# Patient Record
Sex: Female | Born: 1955 | Race: Black or African American | Hispanic: No | Smoking: Never smoker
Health system: Southern US, Community
[De-identification: ages and names within clinical notes are randomized; demographics above are authoritative.]

## PROBLEM LIST (undated history)

## (undated) DIAGNOSIS — E0590 Thyrotoxicosis, unspecified without thyrotoxic crisis or storm: Secondary | ICD-10-CM

## (undated) DIAGNOSIS — E119 Type 2 diabetes mellitus without complications: Secondary | ICD-10-CM

## (undated) HISTORY — DX: Thyrotoxicosis, unspecified without thyrotoxic crisis or storm: E05.90

## (undated) HISTORY — DX: Type 2 diabetes mellitus without complications: E11.9

---

## 2017-02-25 ENCOUNTER — Encounter: Payer: Self-pay | Admitting: "Endocrinology

## 2017-02-25 ENCOUNTER — Ambulatory Visit (INDEPENDENT_AMBULATORY_CARE_PROVIDER_SITE_OTHER): Payer: Self-pay | Admitting: "Endocrinology

## 2017-02-25 VITALS — BP 144/75 | HR 106 | Ht 63.0 in | Wt 196.0 lb

## 2017-02-25 DIAGNOSIS — E0590 Thyrotoxicosis, unspecified without thyrotoxic crisis or storm: Secondary | ICD-10-CM | POA: Insufficient documentation

## 2017-02-25 NOTE — Progress Notes (Signed)
Subjective:    Patient ID: Kendra Gill, female    DOB: June 13, 1956, PCP No primary care provider on file..   Past Medical History:  Diagnosis Date  . Diabetes mellitus, type II (HCC)   . Hyperthyroidism    History reviewed. No pertinent surgical history. Social History   Social History  . Marital status: Widowed    Spouse name: N/A  . Number of children: N/A  . Years of education: N/A   Social History Main Topics  . Smoking status: Never Smoker  . Smokeless tobacco: Never Used  . Alcohol use No  . Drug use: No  . Sexual activity: Not Asked   Other Topics Concern  . None   Social History Narrative  . None   Outpatient Encounter Prescriptions as of 02/25/2017  Medication Sig  . metFORMIN (GLUCOPHAGE) 500 MG tablet Take by mouth 2 (two) times daily with a meal.  . Multiple Vitamin (MULTIVITAMIN) tablet Take 1 tablet by mouth daily.  . [DISCONTINUED] methimazole (TAPAZOLE) 10 MG tablet Take 100 mg by mouth daily.    No facility-administered encounter medications on file as of 02/25/2017.    ALLERGIES: No Known Allergies VACCINATION STATUS:  There is no immunization history on file for this patient.  HPI  The patient presents today with a medical history Of hyperthyroidism from Graves' disease as above, and is being seen in self-referral for same.  - She is not a good historian, reports that she was diagnosed with hyperthyroidism 3 years ago, however her record shows that she was diagnosed at least from 2013 which makes it 5 years. - Patient reports that she has seen endocrinology consultation's before but reportedly declined therapy with I-131 therapy. - She is currently on large doses of methimazole, 100 mg daily.  The patient has been dealing with symptoms of  palpitations, anxiety, weight loss, fatigue, inability to concentrate/focus, and irritability for on and off for several years. These symptoms are progressively worsening and troubling to patient.    The patient denies family history of thyroid dysfunction  ,  the patient denies personal history of goiter. Patient is not on any anti-thyroid medications nor on any thyroid hormone supplements. -  Patient  is hesitantly accepting to proceed with appropriate work up and therapy for thyrotoxicosis.   Review of Systems Constitutional: + weight loss, +fatigue, + subjective hyperthermia. Eyes: no blurry vision, no xerophthalmia ENT: no sore throat, no nodules palpated in throat, no dysphagia/odynophagia, no hoarseness Cardiovascular: no Chest Pain, no Shortness of Breath, + palpitations, no leg swelling Respiratory: no cough, no SOB Gastrointestinal: no Nausea/Vomiting/Diarhhea Musculoskeletal: no muscle/joint aches Skin: no rashes Neurological: + tremors, no numbness, no tingling, no dizziness Psychiatric: no depression, + anxiety  Objective:    BP (!) 144/75   Pulse (!) 106   Ht 5\' 3"  (1.6 m)   Wt 196 lb (88.9 kg)   BMI 34.72 kg/m   Wt Readings from Last 3 Encounters:  02/25/17 196 lb (88.9 kg)    Physical Exam  Constitutional:  Obese, not in acute distress,+ appears significantly hesitant, and has general distrust on medications. Eyes: PERRLA, EOMI, no exophthalmos.  ENT: moist mucous membranes, no thyromegaly, no cervical lymphadenopathy Cardiovascular: normal precordial activity, + tachycardic, no Murmur/Rubs/Gallops Respiratory:  adequate breathing efforts, no gross chest deformity, Clear to auscultation bilaterally Gastrointestinal: abdomen soft, Non -tender, No distension, Bowel Sounds present Musculoskeletal: no gross deformities, strength intact in all four extremities Skin: moist, warm, no rashes Neurological: + tremor  with outstretched hands, Deep tendon reflexes normal in all four extremities.  Her most recent labs from 12/13/2016 showed TSH suppressed to less than 0.002, free T4 high at 2.33 (this is while she is taking methimazole 100 g daily). - Thyroid uptake  and scan from [facility] in 2013 showed uniform uptake of 49% suggesting Graves' disease.  Assessment & Plan:   1. Hyperthyroidism  Patient's history and most recent labs are reviewed. Findings are consistent with thyrotoxicosis likely from hyperthyroidism/Graves' disease. The potential risks of untreated thyrotoxicosis and the need for definitive therapy have been discussed in detail with the patient, and the patient reluctantly accepts appropriate work up.  - Since her last uptake and scan is from 2013 which can't be used for making treatment decision at this time, she will need repeat thyroid uptake and scan after 5 days free of methimazole.  - I advised her to stop methimazole today, will obtain thyroid uptake and scan 5 days later and return in 10 days for reevaluation.    Therapy may involve RAI ablation of the thyroid, with subsequent need for lifelong thyroid hormone replacement. Patient is made aware of this fact and willing to proceed. - Given the fact that she took methimazole beyond maximal dose for multiple years and did not achieve control of Graves' disease, her best choice of therapy  left is I-131 thyroid ablation.  - She will need a low-dose beta blocker for symptomatic control until she gets definitive antithyroid therapy.  - 45 minutes of time was spent on the care of this patient , 50% of which was applied for counseling on complications of thyrotoxicosis, options ,  and the need for definitive therapy to prevent these complications.  - I advised patient to maintain close follow up with PMD for primary care needs.  Follow up plan: Return in about 10 days (around 03/07/2017) for follow up with thyroid uptake and scan.  Marquis Lunch, MD Phone: [phone]  Fax: [phone]   02/25/2017, 4:48 PM

## 2017-02-26 MED ORDER — PROPRANOLOL HCL 20 MG PO TABS: 20.0000 mg | ORAL_TABLET | Freq: Three times a day (TID) | ORAL | 2 refills | Status: DC

## 2017-03-04 ENCOUNTER — Encounter (HOSPITAL_COMMUNITY): Payer: Self-pay

## 2017-03-05 ENCOUNTER — Encounter (HOSPITAL_COMMUNITY): Payer: Self-pay

## 2017-03-11 ENCOUNTER — Ambulatory Visit: Payer: Self-pay | Admitting: "Endocrinology

## 2019-11-05 ENCOUNTER — Other Ambulatory Visit: Payer: Self-pay

## 2019-11-09 ENCOUNTER — Other Ambulatory Visit: Payer: Self-pay

## 2019-11-09 ENCOUNTER — Ambulatory Visit (INDEPENDENT_AMBULATORY_CARE_PROVIDER_SITE_OTHER): Payer: BLUE CROSS/BLUE SHIELD | Admitting: Internal Medicine

## 2019-11-09 ENCOUNTER — Encounter: Payer: Self-pay | Admitting: Internal Medicine

## 2019-11-09 VITALS — BP 124/78 | HR 100 | Temp 98.5°F | Ht 63.0 in | Wt 171.0 lb

## 2019-11-09 DIAGNOSIS — E0500 Thyrotoxicosis with diffuse goiter without thyrotoxic crisis or storm: Secondary | ICD-10-CM | POA: Diagnosis not present

## 2019-11-09 NOTE — Progress Notes (Signed)
Name: Kendra Gill  MRN/ DOB: [id number], [birth year]    Age/ Sex: 64 y.o., female    PCP: Sherrilee Gilles, DO   Reason for Endocrinology Evaluation: Graves' disease     Date of Initial Endocrinology Evaluation: 11/10/2019     HPI: [name] is a 64 y.o. female with a past medical history of graves' disease . The patient presented for initial endocrinology clinic visit on 11/10/2019 for consultative assistance with her Kendra Gill' disease.   She has been diagnosed with graves' disease in 2013, based on a thyroid uptake and scan. She was started on methimazole at that time, she has intermittent compliance due to concerns about side effects  She saw her PCP in 09/2019 and was found to have suppressed TSH < 0.001 uIU/mL with elevated FT4 2.12 and FT3 4.6.  She restarted it again 09/2019    She was having weight loss,with  occasional diarrhea and palpitations   Denies local neck symptoms    No Fh of thyroid disease.   HISTORY:  Past Medical History:  Past Medical History:  Diagnosis Date  . Diabetes mellitus, type II ([place])   . Hyperthyroidism     Past Surgical History: No past surgical history on file.   Social History:  reports that she has never smoked. She has never used smokeless tobacco. She reports that she does not drink alcohol or use drugs.  Family History: family history includes Cancer in her mother; Diabetes in her mother; Hyperlipidemia in her mother; Hypertension in her father and mother.   HOME MEDICATIONS: Allergies as of 11/09/2019   No Known Allergies     Medication List       Accurate as of November 09, 2019 11:59 PM. If you have any questions, ask your nurse or doctor.        metFORMIN 500 MG tablet Commonly known as: GLUCOPHAGE Take 1,000 mg by mouth 2 (two) times daily with a meal.   methimazole 10 MG tablet Commonly known as: TAPAZOLE Take by mouth.   multivitamin tablet Take 1 tablet by mouth daily.   OneTouch Delica Plus  [id number] Misc   OneTouch Verio test strip Generic drug: glucose blood   propranolol 20 MG tablet Commonly known as: INDERAL Take 1 tablet (20 mg total) by mouth 3 (three) times daily. This is for symptom control while definitive therapy is being completed.         REVIEW OF SYSTEMS: A comprehensive ROS was conducted with the patient and is negative except as per HPI and below:  ROS     OBJECTIVE:  VS: BP 124/78 (BP Location: Left Arm, Patient Position: Sitting, Cuff Size: Normal)   Pulse 100   Temp 98.5 F (36.9 C)   Ht 5\' 3"  (1.6 m)   Wt 171 lb (77.6 kg)   SpO2 97%   BMI 30.29 kg/m    Wt Readings from Last 3 Encounters:  11/09/19 171 lb (77.6 kg)  02/25/17 196 lb (88.9 kg)     EXAM: General: Pt appears well and is in NAD  Eyes: External eye exam normal without stare, lid lag or exophthalmos.  EOM intact.    Neck: General: Supple without adenopathy. Thyroid: Thyroid size normal.  No goiter or nodules appreciated. No thyroid bruit.  Lungs: Clear with good BS bilat with no rales, rhonchi, or wheezes  Heart: Auscultation: RRR.  Abdomen: Normoactive bowel sounds, soft, nontender, without masses or organomegaly palpable  Extremities:  BL LE: No pretibial  edema normal ROM and strength.  Skin: Hair: Texture and amount normal with gender appropriate distribution Skin Inspection: No rashes Skin Palpation: Skin temperature, texture, and thickness normal to palpation  Neuro: Cranial nerves: II - XII grossly intact  Motor: Normal strength throughout DTRs: 2+ and symmetric in UE without delay in relaxation phase  Mental Status: Judgment, insight: Intact Orientation: Oriented to time, place, and person Mood and affect: No depression, anxiety, or agitation     DATA REVIEWED: Results for Kendra Gill, Kendra Gill (MRN [id number]) as of 11/10/2019 15:02  Ref. Range 11/09/2019 15:45  Sodium Latest Ref Range: 135 - 145 mEq/L 134 (L)  Potassium Latest Ref Range: 3.5 - 5.1 mEq/L 4.3    Chloride Latest Ref Range: 96 - 112 mEq/L 101  CO2 Latest Ref Range: 19 - 32 mEq/L 26  Glucose Latest Ref Range: 70 - 99 mg/dL 829 (H)  BUN Latest Ref Range: 6 - 23 mg/dL 13  Creatinine Latest Ref Range: 0.40 - 1.20 mg/dL 9.37  Calcium Latest Ref Range: 8.4 - 10.5 mg/dL 9.8  Alkaline Phosphatase Latest Ref Range: 39 - 117 U/L 70  Albumin Latest Ref Range: 3.5 - 5.2 g/dL 4.2  AST Latest Ref Range: 0 - 37 U/L 20  ALT Latest Ref Range: 0 - 35 U/L 19  Total Protein Latest Ref Range: 6.0 - 8.3 g/dL 7.4  Total Bilirubin Latest Ref Range: 0.2 - 1.2 mg/dL 0.3  GFR Latest Ref Range: >60.00 mL/min 108.47  WBC Latest Ref Range: 4.0 - 10.5 K/uL 6.3  RBC Latest Ref Range: 3.87 - 5.11 Mil/uL 4.83  Hemoglobin Latest Ref Range: 12.0 - 15.0 g/dL 16.9  HCT Latest Ref Range: 36.0 - 46.0 % 39.9  MCV Latest Ref Range: 78.0 - 100.0 fl 82.5  MCHC Latest Ref Range: 30.0 - 36.0 g/dL 67.8  RDW Latest Ref Range: 11.5 - 15.5 % 12.8  Platelets Latest Ref Range: 150.0 - 400.0 K/uL 267.0  Neutrophils Latest Ref Range: 43.0 - 77.0 % 45.4  Lymphocytes Latest Ref Range: 12.0 - 46.0 % 44.4  Monocytes Relative Latest Ref Range: 3.0 - 12.0 % 8.4  Eosinophil Latest Ref Range: 0.0 - 5.0 % 1.2  Basophil Latest Ref Range: 0.0 - 3.0 % 0.6  NEUT# Latest Ref Range: 1.4 - 7.7 K/uL 2.8  Lymphocyte # Latest Ref Range: 0.7 - 4.0 K/uL 2.8  Monocyte # Latest Ref Range: 0.1 - 1.0 K/uL 0.5  Eosinophils Absolute Latest Ref Range: 0.0 - 0.7 K/uL 0.1  Basophils Absolute Latest Ref Range: 0.0 - 0.1 K/uL 0.0  TSH Latest Ref Range: 0.35 - 4.50 uIU/mL <0.01 Repeated and verified X2. (L)  T4,Free(Direct) Latest Ref Range: 0.60 - 1.60 ng/dL 9.38 (H)      ASSESSMENT/PLAN/RECOMMENDATIONS:   1. Hyperthyroidism secondary to Graves' disease:   - Pt is clinically euthyroid  - No local neck symptoms - We discussed that Graves' Disease is a result of an autoimmune condition involving the thyroid.    We discussed with pt the benefits of  methimazole in the Tx of hyperthyroidism, as well as the possible side effects/complications of anti-thyroid drug Tx (specifically detailing the rare, but serious side effect of agranulocytosis). She was informed of need for regular thyroid function monitoring while on methimazole to ensure appropriate dosage without over-treatment. As well, we discussed the possible side effects of methimazole including the chance of rash, the small chance of liver irritation/juandice and the <=1 in 300-400 chance of sudden onset agranulocytosis.  We discussed importance of  going to ED promptly (and stopping methimazole) if shewere to develop significant fever with severe sore throat of other evidence of acute infection.      We extensively discussed the various treatment options for hyperthyroidism and Graves disease including ablation therapy with radioactive iodine versus antithyroid drug treatment versus surgical therapy.  We recommended to the patient that we felt, at this time, that RAI ablation therapy would be most optimal.  We discussed the various possible benefits versus side effects of the various therapies. Pt would like to stay on methimazole at this time.   I carefully explained to the patient that one of the consequences of I-131 ablation treatment would likely be permanent hypothyroidism which would require long-term replacement therapy with LT4.   Medications : Methimazole 10 mg BID   2. Graves' Disease:   - No extra thyroidal manifestations of graves' disease    F/u in 3 months   Signed electronically by: Lyndle Herrlich, MD  [facility] Endocrinology  [facility] [address] [place], [place] [address] Phone: [phone] FAX: [phone]   CC: Jonathon Bellows, DO [address] [place] [address] Phone: [phone] Fax: [phone]   Return to Endocrinology clinic as below: Future Appointments  Date Time Provider Department Center  02/12/2020   3:40 PM Letita Prentiss, Konrad Dolores, MD [facility] None

## 2019-11-09 NOTE — Patient Instructions (Signed)
We recommend that you follow these hyperthyroidism instructions at home:  1) Take Methimazole 10mg once a day  If you develop severe sore throat with high fevers OR develop unexplained yellowing of your skin, eyes, under your tongue, severe abdominal pain with nausea or vomiting --> then please get evaluated immediately.      

## 2019-11-10 ENCOUNTER — Telehealth: Payer: Self-pay | Admitting: Internal Medicine

## 2019-11-10 ENCOUNTER — Encounter: Payer: Self-pay | Admitting: Internal Medicine

## 2019-11-10 LAB — CBC WITH DIFFERENTIAL/PLATELET
Basophils Absolute: 0 10*3/uL (ref 0.0–0.1)
Basophils Relative: 0.6 % (ref 0.0–3.0)
Eosinophils Absolute: 0.1 10*3/uL (ref 0.0–0.7)
Eosinophils Relative: 1.2 % (ref 0.0–5.0)
HCT: 39.9 % (ref 36.0–46.0)
Hemoglobin: 12.8 g/dL (ref 12.0–15.0)
Lymphocytes Relative: 44.4 % (ref 12.0–46.0)
Lymphs Abs: 2.8 10*3/uL (ref 0.7–4.0)
MCHC: 32 g/dL (ref 30.0–36.0)
MCV: 82.5 fl (ref 78.0–100.0)
Monocytes Absolute: 0.5 10*3/uL (ref 0.1–1.0)
Monocytes Relative: 8.4 % (ref 3.0–12.0)
Neutro Abs: 2.8 10*3/uL (ref 1.4–7.7)
Neutrophils Relative %: 45.4 % (ref 43.0–77.0)
Platelets: 267 10*3/uL (ref 150.0–400.0)
RBC: 4.83 Mil/uL (ref 3.87–5.11)
RDW: 12.8 % (ref 11.5–15.5)
WBC: 6.3 10*3/uL (ref 4.0–10.5)

## 2019-11-10 LAB — T4, FREE: Free T4: 2.97 ng/dL — ABNORMAL HIGH (ref 0.60–1.60)

## 2019-11-10 LAB — COMPREHENSIVE METABOLIC PANEL
ALT: 19 U/L (ref 0–35)
AST: 20 U/L (ref 0–37)
Albumin: 4.2 g/dL (ref 3.5–5.2)
Alkaline Phosphatase: 70 U/L (ref 39–117)
BUN: 13 mg/dL (ref 6–23)
CO2: 26 mEq/L (ref 19–32)
Calcium: 9.8 mg/dL (ref 8.4–10.5)
Chloride: 101 mEq/L (ref 96–112)
Creatinine, Ser: 0.66 mg/dL (ref 0.40–1.20)
GFR: 108.47 mL/min (ref >60.00)
Glucose, Bld: 222 mg/dL — ABNORMAL HIGH (ref 70–99)
Potassium: 4.3 mEq/L (ref 3.5–5.1)
Sodium: 134 mEq/L — ABNORMAL LOW (ref 135–145)
Total Bilirubin: 0.3 mg/dL (ref 0.2–1.2)
Total Protein: 7.4 g/dL (ref 6.0–8.3)

## 2019-11-10 LAB — TSH: TSH: <0.01 u[IU]/mL — ABNORMAL LOW (ref 0.35–4.50)

## 2019-11-10 MED ORDER — METHIMAZOLE 10 MG PO TABS: 10.0000 mg | ORAL_TABLET | Freq: Two times a day (BID) | ORAL | 6 refills | Status: DC

## 2019-11-10 NOTE — Telephone Encounter (Signed)
Pt aware of results 

## 2019-11-10 NOTE — Telephone Encounter (Signed)
PLease let her know her thyroid is still very active.    She needs to increase methimazole to two tablets daily     Thanks  Abby Raelyn Mora, MD  [facility] Endocrinology  [facility] [address] [place], [place] [address] Phone: [phone] FAX: [phone]

## 2020-02-08 ENCOUNTER — Ambulatory Visit: Payer: BLUE CROSS/BLUE SHIELD | Admitting: Internal Medicine

## 2020-02-12 ENCOUNTER — Other Ambulatory Visit: Payer: Self-pay

## 2020-02-12 ENCOUNTER — Ambulatory Visit (INDEPENDENT_AMBULATORY_CARE_PROVIDER_SITE_OTHER): Payer: Medicare Other | Admitting: Internal Medicine

## 2020-02-12 ENCOUNTER — Encounter: Payer: Self-pay | Admitting: Internal Medicine

## 2020-02-12 VITALS — BP 114/64 | HR 95 | Temp 98.1°F | Ht 63.0 in | Wt 167.6 lb

## 2020-02-12 DIAGNOSIS — E0500 Thyrotoxicosis with diffuse goiter without thyrotoxic crisis or storm: Secondary | ICD-10-CM

## 2020-02-12 NOTE — Progress Notes (Signed)
Name: Kendra Gill  MRN/ DOB: [id number], May 25, 1956    Age/ Sex: 64 y.o., female     PCP: Sherrilee Gilles, DO   Reason for Endocrinology Evaluation: Graves' disease     Initial Endocrinology Clinic Visit: 11/10/2019    PATIENT IDENTIFIER: [name] is a 64 y.o., female with a past medical history of Graves' Disease. She has followed with [facility] since 11/10/219 for consultative assistance with management of her Grave's Disease  HISTORICAL SUMMARY:   She has been diagnosed with graves' disease in 2013, based on a thyroid uptake and scan. She was started on methimazole at that time, she has intermittent compliance due to concerns about side effects  She saw her PCP in 09/2019 and was found to have suppressed TSH < 0.001 uIU/mL with elevated FT4 2.12 and FT3 4.6. She restarted it again 09/2019    No Fh of thyroid disease.    SUBJECTIVE:    Today (02/15/2020):  Kendra Gill is here for a follow up on hyperthyroidism .  She has intentional weight loss Denies palpitations No vomiting or abdominal pain  No local neck symptoms  Denies eye burning or double vision   Missed one weekend of methimazole but otherwise she has been consistent with it.    ROS:  As per HPI.   HISTORY:  Past Medical History:  Past Medical History:  Diagnosis Date  . Diabetes mellitus, type II ([place])   . Hyperthyroidism     Past Surgical History: No past surgical history on file.  Social History:  reports that she has never smoked. She has never used smokeless tobacco. She reports that she does not drink alcohol or use drugs. Family History:  Family History  Problem Relation Age of Onset  . Diabetes Mother   . Hypertension Mother   . Hyperlipidemia Mother   . Cancer Mother   . Hypertension Father      HOME MEDICATIONS: Allergies as of 02/12/2020   No Known Allergies     Medication List       Accurate as of Feb 12, 2020 11:59 PM. If you have any  questions, ask your nurse or doctor.        glipiZIDE 5 MG tablet Commonly known as: GLUCOTROL Take 5 mg by mouth daily.   metFORMIN 500 MG tablet Commonly known as: GLUCOPHAGE Take 1,000 mg by mouth 2 (two) times daily with a meal.   methimazole 10 MG tablet Commonly known as: TAPAZOLE Take 1 tablet (10 mg total) by mouth 3 (three) times daily. What changed: when to take this Changed by: Dorita Sciara, MD   multivitamin tablet Take 1 tablet by mouth daily.   OneTouch Delica Plus [id number] Misc   OneTouch Verio test strip Generic drug: glucose blood   propranolol 20 MG tablet Commonly known as: INDERAL Take 1 tablet (20 mg total) by mouth 3 (three) times daily. This is for symptom control while definitive therapy is being completed.         OBJECTIVE:   PHYSICAL EXAM: VS: BP 114/64 (BP Location: Left Arm, Patient Position: Sitting, Cuff Size: Normal)   Pulse 95   Temp 98.1 F (36.7 C)   Ht 5\' 3"  (1.6 m)   Wt 167 lb 9.6 oz (76 kg)   SpO2 98%   BMI 29.69 kg/m    EXAM: General: Pt appears well and is in NAD  Neck: General: Supple without adenopathy. Thyroid: Thyroid size normal.  No goiter or  nodules appreciated.   Lungs: Clear with good BS bilat with no rales, rhonchi, or wheezes  Heart: Auscultation: RRR.  Abdomen: Normoactive bowel sounds, soft, nontender, without masses or organomegaly palpable  Extremities:  BL LE: No pretibial edema normal ROM and strength.  Skin: Hair: Texture and amount normal with gender appropriate distribution Skin Inspection: No rashes Skin Palpation: Skin temperature, texture, and thickness normal to palpation  Mental Status: Judgment, insight: Intact Orientation: Oriented to time, place, and person Mood and affect: No depression, anxiety, or agitation     DATA REVIEWED:  Results for AVALIE, OCONNOR (MRN [id number]) as of 02/15/2020 09:32  Ref. Range 02/12/2020 15:44  TSH Latest Ref Range: 0.40 - 4.50 mIU/L <0.01  (L)  T4,Free(Direct) Latest Ref Range: 0.8 - 1.8 ng/dL 2.8 (H)     ASSESSMENT / PLAN / RECOMMENDATIONS:   1. Hyperthyroidism secondary to Graves' disease  - Pt is clinically hyperthyroid - Denies local neck symptoms - Repeat labs today show hyperthyroidism,her FT4 level is the same despite increasing methimazole to 2 tabs daily which concerns me that is not consistent with taking it, but she assured me compliance except for that one weekend where she forgot it, so  will increase the dose as below     Medications  Methimazole 10 mg,3 tablets daily    2. Graves' Disease:   - No extra thyroidal manifestations of graves' disease    F/U in 3 months    Signed electronically by: Lyndle Herrlich, MD  [facility] Endocrinology  [facility] [address] [place] [place], [place] [address] Phone: [phone] FAX: [phone]      CC: Jonathon Bellows, DO [address] [place] [address] Phone: [phone]  Fax: [phone]   Return to Endocrinology clinic as below: Future Appointments  Date Time Provider Department Center  05/20/2020 10:10 AM Laxmi Choung, Konrad Dolores, MD [facility] None

## 2020-02-12 NOTE — Patient Instructions (Signed)
-   We recommend that you follow these hyperthyroidism instructions at home:  1) Take Methimazole 10 mg, TWO tablet daily

## 2020-02-13 LAB — T4, FREE: Free T4: 2.8 ng/dL — ABNORMAL HIGH (ref 0.8–1.8)

## 2020-02-13 LAB — TSH: TSH: <0.01 mIU/L — ABNORMAL LOW (ref 0.40–4.50)

## 2020-02-15 MED ORDER — METHIMAZOLE 10 MG PO TABS: 10.0000 mg | ORAL_TABLET | Freq: Three times a day (TID) | ORAL | 6 refills | Status: DC

## 2020-05-20 ENCOUNTER — Other Ambulatory Visit: Payer: Self-pay

## 2020-05-20 ENCOUNTER — Ambulatory Visit (INDEPENDENT_AMBULATORY_CARE_PROVIDER_SITE_OTHER): Payer: Medicare Other | Admitting: Internal Medicine

## 2020-05-20 ENCOUNTER — Encounter: Payer: Self-pay | Admitting: Internal Medicine

## 2020-05-20 VITALS — BP 122/78 | HR 81 | Ht 63.0 in | Wt 187.4 lb

## 2020-05-20 DIAGNOSIS — E0500 Thyrotoxicosis with diffuse goiter without thyrotoxic crisis or storm: Secondary | ICD-10-CM

## 2020-05-20 NOTE — Progress Notes (Signed)
Name: Kendra Gill  MRN/ DOB: [id number], 1956/03/24    Age/ Sex: 64 y.o., female     PCP: Jonathon Bellows, DO   Reason for Endocrinology Evaluation: Graves' disease     Initial Endocrinology Clinic Visit: 11/10/2019    PATIENT IDENTIFIER: Kendra Gill is a 64 y.o., female with a past medical history of Graves' Disease. She has followed with [facility] since 11/10/219 for consultative assistance with management of her Grave's Disease  HISTORICAL SUMMARY:   She has been diagnosed with graves' disease in 2013, based on a thyroid uptake and scan. She was started on methimazole at that time, she has intermittent compliance due to concerns about side effects  She saw her PCP in 09/2019 and was found to have suppressed TSH < 0.001 uIU/mL with elevated FT4 2.12 and FT3 4.6. She restarted it again 09/2019    No Fh of thyroid disease.    SUBJECTIVE:    Today (05/20/2020):  Kendra Gill is here for a follow up on hyperthyroidism .    Pt has been noted with weight gain  Denies palpitations No vomiting or abdominal pain  No local neck symptoms    Methimazole 3 tabs daily     ROS:  As per HPI.   HISTORY:  Past Medical History:  Past Medical History:  Diagnosis Date  . Diabetes mellitus, type II (HCC)   . Hyperthyroidism     Past Surgical History: No past surgical history on file.  Social History:  reports that she has never smoked. She has never used smokeless tobacco. She reports that she does not drink alcohol and does not use drugs. Family History:  Family History  Problem Relation Age of Onset  . Diabetes Mother   . Hypertension Mother   . Hyperlipidemia Mother   . Cancer Mother   . Hypertension Father      HOME MEDICATIONS: Allergies as of 05/20/2020   No Known Allergies     Medication List       Accurate as of May 20, 2020 10:08 AM. If you have any questions, ask your nurse or doctor.        glipiZIDE 5 MG  tablet Commonly known as: GLUCOTROL Take 5 mg by mouth daily.   metFORMIN 500 MG tablet Commonly known as: GLUCOPHAGE Take 1,000 mg by mouth 2 (two) times daily with a meal.   methimazole 10 MG tablet Commonly known as: TAPAZOLE Take 1 tablet (10 mg total) by mouth 3 (three) times daily.   multivitamin tablet Take 1 tablet by mouth daily.   OneTouch Delica Plus Lancet33G Misc   OneTouch Verio test strip Generic drug: glucose blood   propranolol 20 MG tablet Commonly known as: INDERAL Take 1 tablet (20 mg total) by mouth 3 (three) times daily. This is for symptom control while definitive therapy is being completed.         OBJECTIVE:   PHYSICAL EXAM: VS: BP 122/78 (BP Location: Right Arm, Patient Position: Sitting, Cuff Size: Large)   Pulse 81   Ht 5\' 3"  (1.6 m)   Wt 187 lb 6.4 oz (85 kg)   SpO2 99%   BMI 33.20 kg/m    EXAM: General: Pt appears well and is in NAD  Neck: General: Supple without adenopathy. Thyroid: Thyroid size normal.  No goiter or nodules appreciated.   Lungs: Clear with good BS bilat with no rales, rhonchi, or wheezes  Heart: Auscultation: RRR.  Abdomen: Normoactive bowel sounds, soft,  nontender, without masses or organomegaly palpable  Extremities:  BL LE: No pretibial edema normal ROM and strength.  Skin: Hair: Texture and amount normal with gender appropriate distribution Skin Inspection: No rashes Skin Palpation: Skin temperature, texture, and thickness normal to palpation  Mental Status: Judgment, insight: Intact Orientation: Oriented to time, place, and person Mood and affect: No depression, anxiety, or agitation     DATA REVIEWED: Results for ONDINE, GEMME (MRN [id number]) as of 05/22/2020 13:53  Ref. Range 05/20/2020 00:00  TSH Latest Ref Range: 0.450 - 4.500 uIU/mL <0.005 (L)  T4,Free(Direct) Latest Ref Range: 0.82 - 1.77 ng/dL 4.49      ASSESSMENT / PLAN / RECOMMENDATIONS:   1. Hyperthyroidism secondary to Graves'  disease  - Pt is clinically euthyroid  - Denies local neck symptoms - Repeat labs today show normal FT4 and low TSH. No changes at this time    Medications  Methimazole 10 mg,3 tablets daily    2. Graves' Disease:   - No extra thyroidal manifestations of graves' disease    F/U in 3 months  Labs in 6 weeks in [place] ( labs printed to go to [facility])  Signed electronically by: Lyndle Herrlich, Kendra Gill  [facility]  [facility] [address] [place], [place] [address] Phone: [phone] FAX: [phone]      CC: Jonathon Bellows, DO [address] [place] [address] Phone: [phone]  Fax: [phone]   Return to Endocrinology clinic as below: Future Appointments  Date Time Provider Department Center  05/20/2020 10:10 AM Kendra Gill, Kendra Dolores, Kendra Gill [facility] None

## 2020-05-20 NOTE — Patient Instructions (Signed)
-   We recommend that you follow these hyperthyroidism instructions at home:  1) Take Methimazole 10 mg, THREE tablets a day for now    2) Please have labs done at [facility] in 6 weeks in [place], [place]

## 2020-05-21 LAB — TSH: TSH: <0.005 u[IU]/mL — ABNORMAL LOW (ref 0.450–4.500)

## 2020-05-21 LAB — T4, FREE: Free T4: 1.62 ng/dL (ref 0.82–1.77)

## 2020-07-04 ENCOUNTER — Encounter: Payer: Self-pay | Admitting: Internal Medicine

## 2020-07-25 ENCOUNTER — Ambulatory Visit: Payer: Medicare Other | Admitting: Internal Medicine

## 2020-08-01 ENCOUNTER — Ambulatory Visit: Payer: Medicare Other | Admitting: Internal Medicine

## 2020-08-08 ENCOUNTER — Encounter: Payer: Self-pay | Admitting: Internal Medicine

## 2020-08-08 ENCOUNTER — Ambulatory Visit (INDEPENDENT_AMBULATORY_CARE_PROVIDER_SITE_OTHER): Payer: Medicare Other | Admitting: Internal Medicine

## 2020-08-08 ENCOUNTER — Other Ambulatory Visit: Payer: Self-pay

## 2020-08-08 VITALS — BP 124/74 | HR 79 | Ht 63.0 in | Wt 190.0 lb

## 2020-08-08 DIAGNOSIS — E0590 Thyrotoxicosis, unspecified without thyrotoxic crisis or storm: Secondary | ICD-10-CM

## 2020-08-08 DIAGNOSIS — E0500 Thyrotoxicosis with diffuse goiter without thyrotoxic crisis or storm: Secondary | ICD-10-CM

## 2020-08-08 LAB — TSH: TSH: <0.01 u[IU]/mL — ABNORMAL LOW (ref 0.35–4.50)

## 2020-08-08 LAB — T4, FREE: Free T4: 1.1 ng/dL (ref 0.60–1.60)

## 2020-08-08 NOTE — Progress Notes (Signed)
Name: Kendra Gill  MRN/ DOB: [id number], 04/17/1956    Age/ Sex: 64 y.o., female     PCP: Jonathon Bellows, DO   Reason for Endocrinology Evaluation: Grave's disease     Initial Endocrinology Clinic Visit: 11/10/2019    PATIENT IDENTIFIER: Kendra Gill is a 64 y.o., female with a past medical history of Graves' Disease. She has followed with [facility] since 11/10/2019 for consultative assistance with management of her Grave's Disease   HISTORICAL SUMMARY: She has been diagnosed with graves' diseasein 2013, based on a thyroid uptake and scan. She was started on methimazole at that time, she has intermittent compliance due to concerns about side effects  She saw her PCP in 09/2019 and was found to have suppressed TSH < 0.001 uIU/mL with elevated FT4 2.12 and FT3 4.6.She restarted it again 09/2019    No Fh of thyroid disease.   SUBJECTIVE:    Today (08/08/2020):  Kendra Gill is here for a follow up on hyperthyroidism.  Pt denies neck swelling Denies heart palpitation Denies diarrhea, constipation, N/V, abdominal pain Denies depression, anxiety Denies weight gain, weight loss  Taking Methimazole 10 mg, 3 tablets (together) daily    HISTORY:  Past Medical History:  Past Medical History:  Diagnosis Date  . Diabetes mellitus, type II (HCC)   . Hyperthyroidism      Past Surgical History: No past surgical history on file.  Social History:  reports that she has never smoked. She has never used smokeless tobacco. She reports that she does not drink alcohol and does not use drugs. Family History:  Family History  Problem Relation Age of Onset  . Diabetes Mother   . Hypertension Mother   . Hyperlipidemia Mother   . Cancer Mother   . Hypertension Father       HOME MEDICATIONS: Allergies as of 08/08/2020   No Known Allergies     Medication List       Accurate as of August 08, 2020  7:48 AM. If you have any questions, ask your  nurse or doctor.        glipiZIDE 5 MG tablet Commonly known as: GLUCOTROL Take 5 mg by mouth daily.   metFORMIN 500 MG tablet Commonly known as: GLUCOPHAGE Take 1,000 mg by mouth 2 (two) times daily with a meal.   methimazole 10 MG tablet Commonly known as: TAPAZOLE Take 1 tablet (10 mg total) by mouth 3 (three) times daily.   multivitamin tablet Take 1 tablet by mouth daily.   OneTouch Delica Plus Lancet33G Misc   OneTouch Verio test strip Generic drug: glucose blood   propranolol 20 MG tablet Commonly known as: INDERAL Take 1 tablet (20 mg total) by mouth 3 (three) times daily. This is for symptom control while definitive therapy is being completed.         OBJECTIVE:   PHYSICAL EXAM: VS:BP 124/74   Pulse 79   Ht 5\' 3"  (1.6 m)   Wt 190 lb (86.2 kg)   SpO2 98%   BMI 33.66 kg/m    EXAM: General: Pt appears well and is in NAD  Thyromegaly noted on exam   Lungs: Clear with good BS bilat with no rales, rhonchi, or wheezes  Heart: Auscultation: RRR.  Abdomen: Soft, nontender, without masses or organomegaly palpable  Extremities:  BL LE: No pretibial edema normal ROM and strength.  Mental Status: Judgment, insight: Intact Orientation: Oriented to time, place, and person Mood and affect: No depression,  anxiety, or agitation     DATA REVIEWED: Results for Kendra Gill, Kendra Gill (MRN [id number]) as of 08/09/2020 15:21  Ref. Range 08/08/2020 11:13  TSH Latest Ref Range: 0.35 - 4.50 uIU/mL <0.01 (L)  T4,Free(Direct) Latest Ref Range: 0.60 - 1.60 ng/dL 8.31    ASSESSMENT / PLAN / RECOMMENDATIONS:   1. Hyperthyroidism secondary to Graves' disease  - Pt is clinically euthyroid  - Denies local neck symptoms - TFT's stable, will continue current dose     Medications  Continue Methimazole 10 mg,3 tablets daily    2. Graves' Disease:   - No extra thyroidal manifestations of graves' disease    3. Thyromegaly :  - Noted on exam  - Will proceed  with thyroid ultrasound   Signed electronically by:  Lyndle Herrlich, MD  [facility]  [facility] [address] [place], [place] [address] Phone: [phone] FAX: [phone]   CC: Jonathon Bellows, DO [address] [place] [address] Phone: [phone]  Fax: [phone]   Return to Endocrinology clinic as below: Future Appointments  Date Time Provider Department Center  08/08/2020 10:30 AM Shamleffer, Konrad Dolores, MD [facility] None

## 2020-08-08 NOTE — Patient Instructions (Addendum)
-   Labs today  - Thyroid ultrasound has been ordered

## 2020-08-09 ENCOUNTER — Encounter: Payer: Self-pay | Admitting: Internal Medicine

## 2020-08-09 DIAGNOSIS — E0500 Thyrotoxicosis with diffuse goiter without thyrotoxic crisis or storm: Secondary | ICD-10-CM | POA: Insufficient documentation

## 2020-08-22 ENCOUNTER — Ambulatory Visit: Payer: Medicare Other | Admitting: Internal Medicine

## 2020-08-29 ENCOUNTER — Ambulatory Visit
Admission: RE | Admit: 2020-08-29 | Discharge: 2020-08-29 | Disposition: A | Discharge status: Home / Self Care | Payer: Medicare Other | Source: Ambulatory Visit | Attending: Internal Medicine | Admitting: Internal Medicine

## 2020-08-29 DIAGNOSIS — E0500 Thyrotoxicosis with diffuse goiter without thyrotoxic crisis or storm: Secondary | ICD-10-CM

## 2020-10-19 ENCOUNTER — Telehealth: Payer: Self-pay | Admitting: Internal Medicine

## 2020-10-19 MED ORDER — METHIMAZOLE 10 MG PO TABS
10.0000 mg | ORAL_TABLET | Freq: Three times a day (TID) | ORAL | 2 refills | Status: DC
Start: 2020-10-19 — End: 2020-12-13

## 2020-10-19 NOTE — Telephone Encounter (Signed)
Refill sent as requested. 

## 2020-10-19 NOTE — Telephone Encounter (Signed)
Pt called requesting a refill for her Methimazole. Please send to:  PHARMACY: [facility] [address] [place], [place] - [address] [phone] Phone:  [phone]  Fax:  [phone]

## 2020-12-12 ENCOUNTER — Encounter: Payer: Self-pay | Admitting: Internal Medicine

## 2020-12-12 ENCOUNTER — Other Ambulatory Visit: Payer: Self-pay

## 2020-12-12 ENCOUNTER — Ambulatory Visit: Payer: Medicare Other | Admitting: Internal Medicine

## 2020-12-12 VITALS — BP 130/80 | Resp 18 | Ht 64.0 in | Wt 189.0 lb

## 2020-12-12 DIAGNOSIS — E0590 Thyrotoxicosis, unspecified without thyrotoxic crisis or storm: Secondary | ICD-10-CM | POA: Diagnosis not present

## 2020-12-12 DIAGNOSIS — E0500 Thyrotoxicosis with diffuse goiter without thyrotoxic crisis or storm: Secondary | ICD-10-CM | POA: Diagnosis not present

## 2020-12-12 NOTE — Progress Notes (Signed)
Name: Kendra Gill  MRN/ DOB: [id number], 1956-02-13    Age/ Sex: 65 y.o., female     PCP: Jonathon Bellows, DO   Reason for Endocrinology Evaluation: Graves' disease     Initial Endocrinology Clinic Visit: 11/10/2019    PATIENT IDENTIFIER: Kendra Gill is a 65 y.o., female with a past medical history of Graves' Disease. She has followed with [facility] since 11/10/219 for consultative assistance with management of her Grave's Disease  HISTORICAL SUMMARY:   She has been diagnosed with graves' disease in 2013, based on a thyroid uptake and scan. She was started on methimazole at that time, she has intermittent compliance due to concerns about side effects  She saw her PCP in 09/2019 and was found to have suppressed TSH < 0.001 uIU/mL with elevated FT4 2.12 and FT3 4.6. She restarted it again 09/2019    No Fh of thyroid disease.    SUBJECTIVE:   Today (12/12/2020):  Kendra Gill is here for a follow up on hyperthyroidism .   Weight has been stable  Denies palpitations No vomiting or abdominal pain  No local neck symptoms  Denies  Itching  Or buring in the eyes    Last eye exam 09/2018    Methimazole 3 tabs daily      HISTORY:  Past Medical History:  Past Medical History:  Diagnosis Date   Diabetes mellitus, type II (HCC)    Hyperthyroidism     Past Surgical History: No past surgical history on file.  Social History:  reports that she has never smoked. She has never used smokeless tobacco. She reports that she does not drink alcohol and does not use drugs. Family History:  Family History  Problem Relation Age of Onset   Diabetes Mother    Hypertension Mother    Hyperlipidemia Mother    Cancer Mother    Hypertension Father      HOME MEDICATIONS: Allergies as of 12/12/2020   No Known Allergies     Medication List       Accurate as of December 12, 2020  1:27 PM. If you have any questions, ask your nurse or doctor.         glipiZIDE 5 MG tablet Commonly known as: GLUCOTROL Take 5 mg by mouth daily.   metFORMIN 500 MG tablet Commonly known as: GLUCOPHAGE Take 1,000 mg by mouth 2 (two) times daily with a meal.   methimazole 10 MG tablet Commonly known as: TAPAZOLE Take 1 tablet (10 mg total) by mouth 3 (three) times daily.   multivitamin tablet Take 1 tablet by mouth daily.   OneTouch Delica Plus Lancet33G Misc   OneTouch Verio test strip Generic drug: glucose blood   propranolol 20 MG tablet Commonly known as: INDERAL Take 1 tablet (20 mg total) by mouth 3 (three) times daily. This is for symptom control while definitive therapy is being completed.         OBJECTIVE:   PHYSICAL EXAM: VS: BP 130/80 (BP Location: Right Arm, Patient Position: Sitting, Cuff Size: Normal)    Resp 18    Ht 5\' 4"  (1.626 m)    Wt 189 lb (85.7 kg)    BMI 32.44 kg/m    EXAM: General: Pt appears well and is in NAD  Neck: General: Supple without adenopathy. Thyroid: Thyroid size normal.  No goiter or nodules appreciated.   Lungs: Clear with good BS bilat with no rales, rhonchi, or wheezes  Heart: Auscultation: RRR.  Abdomen: Normoactive bowel sounds, soft, nontender, without masses or organomegaly palpable  Extremities:  BL LE: No pretibial edema normal ROM and strength.  Mental Status: Judgment, insight: Intact Orientation: Oriented to time, place, and person Mood and affect: No depression, anxiety, or agitation     DATA REVIEWED: Results for Kendra Gill, Kendra Gill (MRN [id number]) as of 12/13/2020 12:17  Ref. Range 08/08/2020 11:13 12/12/2020 11:03  TSH Latest Ref Range: 0.35 - 4.50 uIU/mL <0.01 (L) 0.02 (L)  T4,Free(Direct) Latest Ref Range: 0.60 - 1.60 ng/dL 0.97 3.53    Thyroid ultrasound 08/29/2020   Parenchymal Echotexture: Markedly heterogenous  Isthmus: 0.6 cm  Right lobe: 5.5 x 2.4 x 2.5 cm  Left lobe: 5.7 x 2.6 x 2.8  cm  _________________________________________________________  Estimated total number of nodules >/= 1 cm: 0  Number of spongiform nodules >/=  2 cm not described below (TR1): 0  Number of mixed cystic and solid nodules >/= 1.5 cm not described below (TR2): 0  _________________________________________________________  Nodule # 1:  Location: Right; Mid  Maximum size: 0.7 cm; Other 2 dimensions: 0.6 x 0.4 cm  Composition: solid/almost completely solid (2)  Echogenicity: hypoechoic (2)  Shape: not taller-than-wide (0)  Margins: smooth (0)  Echogenic foci: none (0)  ACR TI-RADS total points: 4.  ACR TI-RADS risk category: TR4 (4-6 points).  ACR TI-RADS recommendations:  Given size (<0.9 cm) and appearance, this nodule does NOT meet TI-RADS criteria for biopsy or dedicated follow-up.  _________________________________________________________  Nodule # 2:  Location: Right; Inferior  Maximum size: 0.9 cm; Other 2 dimensions: 0.70.6 cm  Composition: solid/almost completely solid (2)  Echogenicity: hypoechoic (2)  Shape: not taller-than-wide (0)  Margins: ill-defined (0)  Echogenic foci: none (0)  ACR TI-RADS total points: 4.  ACR TI-RADS risk category: TR4 (4-6 points).  ACR TI-RADS recommendations:  Given size (<0.9 cm) and appearance, this nodule does NOT meet TI-RADS criteria for biopsy or dedicated follow-up.  _________________________________________________________  Nodule # 3:  Location: Left; Mid  Maximum size: 0.9 cm; Other 2 dimensions: 0.7 x 0.4 cm  Composition: solid/almost completely solid (2)  Echogenicity: hypoechoic (2)  Shape: not taller-than-wide (0)  Margins: ill-defined (0)  Echogenic foci: none (0)  ACR TI-RADS total points: 4.  ACR TI-RADS risk category: TR4 (4-6 points).  ACR TI-RADS recommendations:  Given size (<0.9 cm) and appearance, this nodule does NOT meet TI-RADS  criteria for biopsy or dedicated follow-up.  _________________________________________________________  Nodule # 4:  Location: Left; Inferior  Maximum size: 0.7 cm; Other 2 dimensions: 0.7 x 0.5 cm  Composition: solid/almost completely solid (2)  Echogenicity: hypoechoic (2)  Shape: not taller-than-wide (0)  Margins: ill-defined (0)  Echogenic foci: none (0)  ACR TI-RADS total points: 4.  ACR TI-RADS risk category: TR4 (4-6 points).  ACR TI-RADS recommendations:  Given size (<0.9 cm) and appearance, this nodule does NOT meet TI-RADS criteria for biopsy or dedicated follow-up.  _________________________________________________________  IMPRESSION: 1. Heterogeneously enlarged thyroid compatible with multinodular goiter. 2. Each of the observed thyroid nodules have benign sonographic characteristics and do not warrant additional ultrasound follow-up or tissue sampling.  ASSESSMENT / PLAN / RECOMMENDATIONS:   1. Hyperthyroidism secondary to Graves' disease  - Pt is clinically euthyroid  - Denies local neck symptoms - Repeat labs today show normal and stable FT4 but her TSH is still low., no changes     Medications  Methimazole 10 mg,3 tablets daily    2. Graves' Disease:   - No extra thyroidal manifestations  of graves' disease    F/U in 4 months    Signed electronically by: Lyndle Herrlich, MD  [facility]  [facility] [address] [place], [place] [address] Phone: [phone] FAX: [phone]      CC: Jonathon Bellows, DO [address] [place] [address] Phone: [phone]  Fax: [phone]   Return to Endocrinology clinic as below: Future Appointments  Date Time Provider Department Center  04/17/2021 10:50 AM Wojciech Willetts, Konrad Dolores, MD [facility] None

## 2020-12-12 NOTE — Patient Instructions (Signed)
Labs today

## 2020-12-13 LAB — T4, FREE: Free T4: 0.92 ng/dL (ref 0.60–1.60)

## 2020-12-13 LAB — TSH: TSH: 0.02 u[IU]/mL — ABNORMAL LOW (ref 0.35–4.50)

## 2020-12-13 MED ORDER — METHIMAZOLE 10 MG PO TABS
10.0000 mg | ORAL_TABLET | Freq: Three times a day (TID) | ORAL | 3 refills | Status: DC
Start: 2020-12-13 — End: 2021-05-02

## 2021-04-17 ENCOUNTER — Ambulatory Visit: Payer: Medicare Other | Admitting: Internal Medicine

## 2021-04-24 ENCOUNTER — Ambulatory Visit (INDEPENDENT_AMBULATORY_CARE_PROVIDER_SITE_OTHER): Payer: Medicare Other | Admitting: Internal Medicine

## 2021-04-24 ENCOUNTER — Other Ambulatory Visit: Payer: Self-pay

## 2021-04-24 ENCOUNTER — Encounter: Payer: Self-pay | Admitting: Internal Medicine

## 2021-04-24 VITALS — BP 144/88 | HR 72 | Ht 64.0 in | Wt 188.4 lb

## 2021-04-24 DIAGNOSIS — E0590 Thyrotoxicosis, unspecified without thyrotoxic crisis or storm: Secondary | ICD-10-CM

## 2021-04-24 DIAGNOSIS — E0500 Thyrotoxicosis with diffuse goiter without thyrotoxic crisis or storm: Secondary | ICD-10-CM | POA: Diagnosis not present

## 2021-04-24 LAB — CBC WITH DIFFERENTIAL/PLATELET
Basophils Absolute: 0 10*3/uL (ref 0.0–0.1)
Basophils Relative: 0.6 % (ref 0.0–3.0)
Eosinophils Absolute: 0.1 10*3/uL (ref 0.0–0.7)
Eosinophils Relative: 2.2 % (ref 0.0–5.0)
HCT: 37.1 % (ref 36.0–46.0)
Hemoglobin: 12.1 g/dL (ref 12.0–15.0)
Lymphocytes Relative: 47 % — ABNORMAL HIGH (ref 12.0–46.0)
Lymphs Abs: 2.7 10*3/uL (ref 0.7–4.0)
MCHC: 32.7 g/dL (ref 30.0–36.0)
MCV: 86.6 fl (ref 78.0–100.0)
Monocytes Absolute: 0.4 10*3/uL (ref 0.1–1.0)
Monocytes Relative: 7.8 % (ref 3.0–12.0)
Neutro Abs: 2.4 10*3/uL (ref 1.4–7.7)
Neutrophils Relative %: 42.4 % — ABNORMAL LOW (ref 43.0–77.0)
Platelets: 258 10*3/uL (ref 150.0–400.0)
RBC: 4.28 Mil/uL (ref 3.87–5.11)
RDW: 13.3 % (ref 11.5–15.5)
WBC: 5.8 10*3/uL (ref 4.0–10.5)

## 2021-04-24 LAB — COMPREHENSIVE METABOLIC PANEL
ALT: 19 U/L (ref 0–35)
AST: 17 U/L (ref 0–37)
Albumin: 4.3 g/dL (ref 3.5–5.2)
Alkaline Phosphatase: 50 U/L (ref 39–117)
BUN: 9 mg/dL (ref 6–23)
CO2: 25 mEq/L (ref 19–32)
Calcium: 9.6 mg/dL (ref 8.4–10.5)
Chloride: 104 mEq/L (ref 96–112)
Creatinine, Ser: 0.83 mg/dL (ref 0.40–1.20)
GFR: 73.01 mL/min (ref >60.00)
Glucose, Bld: 180 mg/dL — ABNORMAL HIGH (ref 70–99)
Potassium: 3.8 mEq/L (ref 3.5–5.1)
Sodium: 138 mEq/L (ref 135–145)
Total Bilirubin: 0.4 mg/dL (ref 0.2–1.2)
Total Protein: 7.8 g/dL (ref 6.0–8.3)

## 2021-04-24 LAB — T4, FREE: Free T4: 0.77 ng/dL (ref 0.60–1.60)

## 2021-04-24 LAB — TSH: TSH: 0.97 u[IU]/mL (ref 0.35–5.50)

## 2021-04-24 NOTE — Progress Notes (Addendum)
Name: Kendra Gill  MRN/ DOB: [id number], 1956-09-21    Age/ Sex: 65 y.o., female     PCP: Jonathon Bellows, DO   Reason for Endocrinology Evaluation: Graves' disease     Initial Endocrinology Clinic Visit: 11/10/2019    PATIENT IDENTIFIER: Kendra Gill is a 65 y.o., female with a past medical history of Graves' Disease. She has followed with [facility] since 11/10/219 for consultative assistance with management of her Grave's Disease  HISTORICAL SUMMARY:   She has been diagnosed with graves' disease in 2013, based on a thyroid uptake and scan. She was started on methimazole at that time, she has intermittent compliance due to concerns about side effects   She saw her PCP in 09/2019 and was found to have suppressed TSH < 0.001 uIU/mL with elevated FT4 2.12 and FT3 4.6. She restarted it again 09/2019     No Fh of thyroid disease.    SUBJECTIVE:   Today (04/24/2021):  Ms. [name] is here for a follow up on hyperthyroidism .   Weight has been stable  Denies palpitations No vomiting or abdominal pain  No local neck symptoms  Denies  Itching  Or buring in the eyes    Last eye exam 09/2018   Methimazole 3 tabs daily      HISTORY:  Past Medical History:  Past Medical History:  Diagnosis Date   Diabetes mellitus, type II (HCC)    Hyperthyroidism    Past Surgical History: No past surgical history on file. Social History:  reports that she has never smoked. She has never used smokeless tobacco. She reports that she does not drink alcohol and does not use drugs. Family History:  Family History  Problem Relation Age of Onset   Diabetes Mother    Hypertension Mother    Hyperlipidemia Mother    Cancer Mother    Hypertension Father      HOME MEDICATIONS: Allergies as of 04/24/2021   No Known Allergies      Medication List        Accurate as of April 24, 2021  1:30 PM. If you have any questions, ask your nurse or doctor.           glipiZIDE 5 MG tablet Commonly known as: GLUCOTROL Take 5 mg by mouth daily.   metFORMIN 500 MG tablet Commonly known as: GLUCOPHAGE Take 1,000 mg by mouth 2 (two) times daily with a meal.   methimazole 10 MG tablet Commonly known as: TAPAZOLE Take 1 tablet (10 mg total) by mouth 3 (three) times daily.   multivitamin tablet Take 1 tablet by mouth daily.   OneTouch Delica Plus Lancet33G Misc   OneTouch Verio test strip Generic drug: glucose blood   propranolol 20 MG tablet Commonly known as: INDERAL Take 1 tablet (20 mg total) by mouth 3 (three) times daily. This is for symptom control while definitive therapy is being completed.          OBJECTIVE:   PHYSICAL EXAM: VS: BP (!) 144/88 (BP Location: Left Arm, Patient Position: Sitting)   Pulse 72   Ht 5\' 4"  (1.626 m)   Wt 188 lb 6.4 oz (85.5 kg)   SpO2 97%   BMI 32.34 kg/m    EXAM: General: Pt appears well and is in NAD  Neck: General: Supple without adenopathy. Thyroid: Thyroid size normal.  No goiter or nodules appreciated.   Lungs: Clear with good BS bilat with no rales, rhonchi, or wheezes  Heart: Auscultation: RRR.  Abdomen: Normoactive bowel sounds, soft, nontender, without masses or organomegaly palpable  Extremities:  BL LE: No pretibial edema normal ROM and strength.  Mental Status: Judgment, insight: Intact Orientation: Oriented to time, place, and person Mood and affect: No depression, anxiety, or agitation     DATA REVIEWED:  Results for Kendra Gill (MRN [id number]) as of 04/24/2021 13:32  Ref. Range 04/24/2021 10:06  Sodium Latest Ref Range: 135 - 145 mEq/L 138  Potassium Latest Ref Range: 3.5 - 5.1 mEq/L 3.8  Chloride Latest Ref Range: 96 - 112 mEq/L 104  CO2 Latest Ref Range: 19 - 32 mEq/L 25  Glucose Latest Ref Range: 70 - 99 mg/dL 716 (H)  BUN Latest Ref Range: 6 - 23 mg/dL 9  Creatinine Latest Ref Range: 0.40 - 1.20 mg/dL 9.67  Calcium Latest Ref Range: 8.4 - 10.5 mg/dL 9.6   Alkaline Phosphatase Latest Ref Range: 39 - 117 U/L 50  Albumin Latest Ref Range: 3.5 - 5.2 g/dL 4.3  AST Latest Ref Range: 0 - 37 U/L 17  ALT Latest Ref Range: 0 - 35 U/L 19  Total Protein Latest Ref Range: 6.0 - 8.3 g/dL 7.8  Total Bilirubin Latest Ref Range: 0.2 - 1.2 mg/dL 0.4  GFR Latest Ref Range: >60.00 mL/min 73.01  WBC Latest Ref Range: 4.0 - 10.5 K/uL 5.8  RBC Latest Ref Range: 3.87 - 5.11 Mil/uL 4.28  Hemoglobin Latest Ref Range: 12.0 - 15.0 g/dL 89.3  HCT Latest Ref Range: 36.0 - 46.0 % 37.1  MCV Latest Ref Range: 78.0 - 100.0 fl 86.6  MCHC Latest Ref Range: 30.0 - 36.0 g/dL 81.0  RDW Latest Ref Range: 11.5 - 15.5 % 13.3  Platelets Latest Ref Range: 150.0 - 400.0 K/uL 258.0  Neutrophils Latest Ref Range: 43.0 - 77.0 % 42.4 (L)  Lymphocytes Latest Ref Range: 12.0 - 46.0 % 47.0 (H)  Monocytes Relative Latest Ref Range: 3.0 - 12.0 % 7.8  Eosinophil Latest Ref Range: 0.0 - 5.0 % 2.2  Basophil Latest Ref Range: 0.0 - 3.0 % 0.6  NEUT# Latest Ref Range: 1.4 - 7.7 K/uL 2.4  Lymphocyte # Latest Ref Range: 0.7 - 4.0 K/uL 2.7  Monocyte # Latest Ref Range: 0.1 - 1.0 K/uL 0.4  Eosinophils Absolute Latest Ref Range: 0.0 - 0.7 K/uL 0.1  Basophils Absolute Latest Ref Range: 0.0 - 0.1 K/uL 0.0  TSH Latest Ref Range: 0.35 - 5.50 uIU/mL 0.97  T4,Free(Direct) Latest Ref Range: 0.60 - 1.60 ng/dL 1.75    Thyroid ultrasound 08/29/2020   Parenchymal Echotexture: Markedly heterogenous   Isthmus: 0.6 cm   Right lobe: 5.5 x 2.4 x 2.5 cm   Left lobe: 5.7 x 2.6 x 2.8 cm   _________________________________________________________   Estimated total number of nodules >/= 1 cm: 0   Number of spongiform nodules >/=  2 cm not described below (TR1): 0   Number of mixed cystic and solid nodules >/= 1.5 cm not described below (TR2): 0   _________________________________________________________   Nodule # 1:   Location: Right; Mid   Maximum size: 0.7 cm; Other 2 dimensions: 0.6 x 0.4  cm   Composition: solid/almost completely solid (2)   Echogenicity: hypoechoic (2)   Shape: not taller-than-wide (0)   Margins: smooth (0)   Echogenic foci: none (0)   ACR TI-RADS total points: 4.   ACR TI-RADS risk category: TR4 (4-6 points).   ACR TI-RADS recommendations:   Given size (<0.9 cm) and appearance, this  nodule does NOT meet TI-RADS criteria for biopsy or dedicated follow-up.   _________________________________________________________   Nodule # 2:   Location: Right; Inferior   Maximum size: 0.9 cm; Other 2 dimensions: 0.70.6 cm   Composition: solid/almost completely solid (2)   Echogenicity: hypoechoic (2)   Shape: not taller-than-wide (0)   Margins: ill-defined (0)   Echogenic foci: none (0)   ACR TI-RADS total points: 4.   ACR TI-RADS risk category: TR4 (4-6 points).   ACR TI-RADS recommendations:   Given size (<0.9 cm) and appearance, this nodule does NOT meet TI-RADS criteria for biopsy or dedicated follow-up.   _________________________________________________________   Nodule # 3:   Location: Left; Mid   Maximum size: 0.9 cm; Other 2 dimensions: 0.7 x 0.4 cm   Composition: solid/almost completely solid (2)   Echogenicity: hypoechoic (2)   Shape: not taller-than-wide (0)   Margins: ill-defined (0)   Echogenic foci: none (0)   ACR TI-RADS total points: 4.   ACR TI-RADS risk category: TR4 (4-6 points).   ACR TI-RADS recommendations:   Given size (<0.9 cm) and appearance, this nodule does NOT meet TI-RADS criteria for biopsy or dedicated follow-up.   _________________________________________________________   Nodule # 4:   Location: Left; Inferior   Maximum size: 0.7 cm; Other 2 dimensions: 0.7 x 0.5 cm   Composition: solid/almost completely solid (2)   Echogenicity: hypoechoic (2)   Shape: not taller-than-wide (0)   Margins: ill-defined (0)   Echogenic foci: none (0)   ACR TI-RADS total points: 4.   ACR  TI-RADS risk category: TR4 (4-6 points).   ACR TI-RADS recommendations:   Given size (<0.9 cm) and appearance, this nodule does NOT meet TI-RADS criteria for biopsy or dedicated follow-up.   _________________________________________________________   IMPRESSION: 1. Heterogeneously enlarged thyroid compatible with multinodular goiter. 2. Each of the observed thyroid nodules have benign sonographic characteristics and do not warrant additional ultrasound follow-up or tissue sampling.  ASSESSMENT / PLAN / RECOMMENDATIONS:   Hyperthyroidism secondary to Graves' disease  - Pt is clinically euthyroid  - Denies local neck symptoms - Repeat labs today show normal TFT's - Slight decrease in neutrophil count, will continue to monitor - We discussed RAI ablation as alternative therapy but she declines at this time   Medications  Continue Methimazole 10 mg,3 tablets daily    2. Graves' Disease:    - No extra thyroidal manifestations of graves' disease  - Pt urged to have annual eye exams     F/U in 4 months    Signed electronically by: Lyndle Herrlich, MD  [facility]  [facility] [address] [place] [place], [place] [address] Phone: [phone] FAX: [phone]      CC: Jonathon Bellows, DO [address] [place] [address] Phone: [phone]  Fax: [phone]   Return to Endocrinology clinic as below: Future Appointments  Date Time Provider Department Center  08/24/2021 10:30 AM [place] Pacella, Konrad Dolores, MD [id number] None

## 2021-05-01 ENCOUNTER — Encounter: Payer: Self-pay | Admitting: Internal Medicine

## 2021-05-02 ENCOUNTER — Other Ambulatory Visit: Payer: Self-pay | Admitting: Internal Medicine

## 2021-05-10 ENCOUNTER — Encounter: Payer: Self-pay | Admitting: Internal Medicine

## 2021-07-07 ENCOUNTER — Telehealth: Payer: Self-pay | Admitting: Internal Medicine

## 2021-07-07 MED ORDER — METHIMAZOLE 10 MG PO TABS: 10.0000 mg | ORAL_TABLET | Freq: Three times a day (TID) | ORAL | 0 refills | Status: DC

## 2021-07-07 NOTE — Telephone Encounter (Signed)
Pt calling in to request refill of methimazole (TAPAZOLE) 10 MG tablet to [facility] [place], [place] - [address]. Pt contact [phone]

## 2021-07-07 NOTE — Telephone Encounter (Signed)
Script sent  

## 2021-08-24 ENCOUNTER — Encounter: Payer: Self-pay | Admitting: Internal Medicine

## 2021-08-24 ENCOUNTER — Other Ambulatory Visit: Payer: Self-pay

## 2021-08-24 ENCOUNTER — Ambulatory Visit (INDEPENDENT_AMBULATORY_CARE_PROVIDER_SITE_OTHER): Payer: Medicare Other | Admitting: Internal Medicine

## 2021-08-24 VITALS — BP 130/78 | HR 91 | Ht 64.0 in | Wt 185.5 lb

## 2021-08-24 DIAGNOSIS — E0590 Thyrotoxicosis, unspecified without thyrotoxic crisis or storm: Secondary | ICD-10-CM | POA: Diagnosis not present

## 2021-08-24 DIAGNOSIS — D709 Neutropenia, unspecified: Secondary | ICD-10-CM

## 2021-08-24 DIAGNOSIS — E0500 Thyrotoxicosis with diffuse goiter without thyrotoxic crisis or storm: Secondary | ICD-10-CM | POA: Diagnosis not present

## 2021-08-24 LAB — COMPREHENSIVE METABOLIC PANEL
ALT: 18 U/L (ref 0–35)
AST: 15 U/L (ref 0–37)
Albumin: 4.2 g/dL (ref 3.5–5.2)
Alkaline Phosphatase: 61 U/L (ref 39–117)
BUN: 12 mg/dL (ref 6–23)
CO2: 26 mEq/L (ref 19–32)
Calcium: 9.9 mg/dL (ref 8.4–10.5)
Chloride: 105 mEq/L (ref 96–112)
Creatinine, Ser: 0.81 mg/dL (ref 0.40–1.20)
GFR: 75 mL/min (ref >60.00)
Glucose, Bld: 229 mg/dL — ABNORMAL HIGH (ref 70–99)
Potassium: 4 mEq/L (ref 3.5–5.1)
Sodium: 139 mEq/L (ref 135–145)
Total Bilirubin: 0.3 mg/dL (ref 0.2–1.2)
Total Protein: 7.3 g/dL (ref 6.0–8.3)

## 2021-08-24 LAB — CBC WITH DIFFERENTIAL/PLATELET
Basophils Absolute: 0 10*3/uL (ref 0.0–0.1)
Basophils Relative: 0.5 % (ref 0.0–3.0)
Eosinophils Absolute: 0.1 10*3/uL (ref 0.0–0.7)
Eosinophils Relative: 1.3 % (ref 0.0–5.0)
HCT: 39.3 % (ref 36.0–46.0)
Hemoglobin: 12.6 g/dL (ref 12.0–15.0)
Lymphocytes Relative: 35.6 % (ref 12.0–46.0)
Lymphs Abs: 2.1 10*3/uL (ref 0.7–4.0)
MCHC: 32.2 g/dL (ref 30.0–36.0)
MCV: 85.8 fl (ref 78.0–100.0)
Monocytes Absolute: 0.4 10*3/uL (ref 0.1–1.0)
Monocytes Relative: 7 % (ref 3.0–12.0)
Neutro Abs: 3.2 10*3/uL (ref 1.4–7.7)
Neutrophils Relative %: 55.6 % (ref 43.0–77.0)
Platelets: 253 10*3/uL (ref 150.0–400.0)
RBC: 4.58 Mil/uL (ref 3.87–5.11)
RDW: 12.9 % (ref 11.5–15.5)
WBC: 5.8 10*3/uL (ref 4.0–10.5)

## 2021-08-24 LAB — T4, FREE: Free T4: 0.99 ng/dL (ref 0.60–1.60)

## 2021-08-24 LAB — TSH: TSH: 0.01 u[IU]/mL — ABNORMAL LOW (ref 0.35–5.50)

## 2021-08-24 NOTE — Progress Notes (Signed)
Name: Kendra Gill  MRN/ DOB: [id number], Sep 29, 1955    Age/ Sex: 65 y.o., female     PCP: Jonathon Bellows, DO   Reason for Endocrinology Evaluation: Graves' disease     Initial Endocrinology Clinic Visit: 11/10/2019    PATIENT IDENTIFIER: [name] is a 65 y.o., female with a past medical history of Graves' Disease. She has followed with [facility] since 11/10/219 for consultative assistance with management of her Grave's Disease  HISTORICAL SUMMARY:   She has been diagnosed with graves' disease in 2013, based on a thyroid uptake and scan. She was started on methimazole at that time, she has intermittent compliance due to concerns about side effects   She saw her PCP in 09/2019 and was found to have suppressed TSH < 0.001 uIU/mL with elevated FT4 2.12 and FT3 4.6. She restarted it again 09/2019     No Fh of thyroid disease.    SUBJECTIVE:   Today (08/24/2021):  Kendra Gill is here for a follow up on hyperthyroidism .  Weight has been decreasing  Denies palpitations No vomiting or abdominal pain  No local neck symptoms  Denies  Itching  Or buring in the eyes    Last eye exam 2021  Methimazole 10 mg, 3 tabs daily     HISTORY:  Past Medical History:  Past Medical History:  Diagnosis Date   Diabetes mellitus, type II (HCC)    Hyperthyroidism    Past Surgical History: No past surgical history on file. Social History:  reports that she has never smoked. She has never used smokeless tobacco. She reports that she does not drink alcohol and does not use drugs. Family History:  Family History  Problem Relation Age of Onset   Diabetes Mother    Hypertension Mother    Hyperlipidemia Mother    Cancer Mother    Hypertension Father      HOME MEDICATIONS: Allergies as of 08/24/2021   No Known Allergies      Medication List        Accurate as of August 24, 2021 10:45 AM. If you have any questions, ask your nurse or doctor.           glipiZIDE 5 MG tablet Commonly known as: GLUCOTROL Take 5 mg by mouth daily.   metFORMIN 500 MG tablet Commonly known as: GLUCOPHAGE Take 1,000 mg by mouth 2 (two) times daily with a meal.   methimazole 10 MG tablet Commonly known as: TAPAZOLE Take 1 tablet (10 mg total) by mouth 3 (three) times daily.   multivitamin tablet Take 1 tablet by mouth daily.   OneTouch Delica Plus Lancet33G Misc   OneTouch Verio test strip Generic drug: glucose blood   propranolol 20 MG tablet Commonly known as: INDERAL Take 1 tablet (20 mg total) by mouth 3 (three) times daily. This is for symptom control while definitive therapy is being completed.          OBJECTIVE:   PHYSICAL EXAM: VS: BP 130/78   Pulse 91   Ht 5\' 4"  (1.626 m)   Wt 185 lb 8 oz (84.1 kg)   SpO2 99%   BMI 31.84 kg/m    EXAM: General: Pt appears well and is in NAD  Neck: General: Supple without adenopathy. Thyroid: Thyroid size normal.  No goiter or nodules appreciated.   Lungs: Clear with good BS bilat with no rales, rhonchi, or wheezes  Heart: Auscultation: RRR.  Abdomen: Normoactive bowel sounds, soft, nontender,  without masses or organomegaly palpable  Extremities:  BL LE: No pretibial edema normal ROM and strength.  Mental Status: Judgment, insight: Intact Orientation: Oriented to time, place, and person Mood and affect: No depression, anxiety, or agitation     DATA REVIEWED:   Latest Reference Range & Units 08/24/21 10:58  Sodium 135 - 145 mEq/L 139  Potassium 3.5 - 5.1 mEq/L 4.0  Chloride 96 - 112 mEq/L 105  CO2 19 - 32 mEq/L 26  Glucose 70 - 99 mg/dL 378 (H)  BUN 6 - 23 mg/dL 12  Creatinine 5.88 - 5.02 mg/dL 7.74  Calcium 8.4 - 12.8 mg/dL 9.9  Alkaline Phosphatase 39 - 117 U/L 61  Albumin 3.5 - 5.2 g/dL 4.2  AST 0 - 37 U/L 15  ALT 0 - 35 U/L 18  Total Protein 6.0 - 8.3 g/dL 7.3  Total Bilirubin 0.2 - 1.2 mg/dL 0.3  GFR >78.67 mL/min 75.00  WBC 4.0 - 10.5 K/uL 5.8  RBC 3.87 -  5.11 Mil/uL 4.58  Hemoglobin 12.0 - 15.0 g/dL 67.2  HCT 09.4 - 70.9 % 39.3  MCV 78.0 - 100.0 fl 85.8  MCHC 30.0 - 36.0 g/dL 62.8    Latest Reference Range & Units 08/24/21 10:58  Neutrophils 43.0 - 77.0 % 55.6  Lymphocytes 12.0 - 46.0 % 35.6  Monocytes Relative 3.0 - 12.0 % 7.0  Eosinophil 0.0 - 5.0 % 1.3  Basophil 0.0 - 3.0 % 0.5  NEUT# 1.4 - 7.7 K/uL 3.2  Lymphocyte # 0.7 - 4.0 K/uL 2.1  Monocyte # 0.1 - 1.0 K/uL 0.4  Eosinophils Absolute 0.0 - 0.7 K/uL 0.1  Basophils Absolute 0.0 - 0.1 K/uL 0.0  Glucose 70 - 99 mg/dL 366 (H)  TSH 2.94 - 7.65 uIU/mL 0.01 (L)  T4,Free(Direct) 0.60 - 1.60 ng/dL 4.65     Thyroid ultrasound 08/29/2020   Parenchymal Echotexture: Markedly heterogenous   Isthmus: 0.6 cm   Right lobe: 5.5 x 2.4 x 2.5 cm   Left lobe: 5.7 x 2.6 x 2.8 cm   _________________________________________________________   Estimated total number of nodules >/= 1 cm: 0   Number of spongiform nodules >/=  2 cm not described below (TR1): 0   Number of mixed cystic and solid nodules >/= 1.5 cm not described below (TR2): 0   _________________________________________________________   Nodule # 1:   Location: Right; Mid   Maximum size: 0.7 cm; Other 2 dimensions: 0.6 x 0.4 cm   Composition: solid/almost completely solid (2)   Echogenicity: hypoechoic (2)   Shape: not taller-than-wide (0)   Margins: smooth (0)   Echogenic foci: none (0)   ACR TI-RADS total points: 4.   ACR TI-RADS risk category: TR4 (4-6 points).   ACR TI-RADS recommendations:   Given size (<0.9 cm) and appearance, this nodule does NOT meet TI-RADS criteria for biopsy or dedicated follow-up.   _________________________________________________________   Nodule # 2:   Location: Right; Inferior   Maximum size: 0.9 cm; Other 2 dimensions: 0.70.6 cm   Composition: solid/almost completely solid (2)   Echogenicity: hypoechoic (2)   Shape: not taller-than-wide (0)   Margins:  ill-defined (0)   Echogenic foci: none (0)   ACR TI-RADS total points: 4.   ACR TI-RADS risk category: TR4 (4-6 points).   ACR TI-RADS recommendations:   Given size (<0.9 cm) and appearance, this nodule does NOT meet TI-RADS criteria for biopsy or dedicated follow-up.   _________________________________________________________   Nodule # 3:   Location: Left; Mid  Maximum size: 0.9 cm; Other 2 dimensions: 0.7 x 0.4 cm   Composition: solid/almost completely solid (2)   Echogenicity: hypoechoic (2)   Shape: not taller-than-wide (0)   Margins: ill-defined (0)   Echogenic foci: none (0)   ACR TI-RADS total points: 4.   ACR TI-RADS risk category: TR4 (4-6 points).   ACR TI-RADS recommendations:   Given size (<0.9 cm) and appearance, this nodule does NOT meet TI-RADS criteria for biopsy or dedicated follow-up.   _________________________________________________________   Nodule # 4:   Location: Left; Inferior   Maximum size: 0.7 cm; Other 2 dimensions: 0.7 x 0.5 cm   Composition: solid/almost completely solid (2)   Echogenicity: hypoechoic (2)   Shape: not taller-than-wide (0)   Margins: ill-defined (0)   Echogenic foci: none (0)   ACR TI-RADS total points: 4.   ACR TI-RADS risk category: TR4 (4-6 points).   ACR TI-RADS recommendations:   Given size (<0.9 cm) and appearance, this nodule does NOT meet TI-RADS criteria for biopsy or dedicated follow-up.   _________________________________________________________   IMPRESSION: 1. Heterogeneously enlarged thyroid compatible with multinodular goiter. 2. Each of the observed thyroid nodules have benign sonographic characteristics and do not warrant additional ultrasound follow-up or tissue sampling.  ASSESSMENT / PLAN / RECOMMENDATIONS:   Hyperthyroidism secondary to Graves' disease  - Pt is clinically euthyroid  - Denies local neck symptoms - We again  discussed RAI ablation  and thyroidectomy as  alternative therapy given low neutrophil count. We discussed side effects of thionamide and agranulocytosis  - She has sub-centimeter thyroid nodules which could be contributing to hyperthyroidism. She continues to need high dose of methimazole ~ 1.5 yrs after diagnosis.  - TSH is suppressed again, I am concerned about compliance issues, but she assured me compliance and based on that information, will increase methimazole as below   Medications  Increase  Methimazole 10 mg, 2 tablets BID    2. Graves' Disease:    - No extra thyroidal manifestations of graves' disease    3. Neutropenia :  - Resolved but will continue to monitor  F/U in 4 months    Signed electronically by: Lyndle Herrlich, MD  [facility]  [facility] [address] [place], [place] [address] Phone: [phone] FAX: [phone]      CC: Jonathon Bellows, DO [address] [place] [address] Phone: [phone]  Fax: [phone]   Return to Endocrinology clinic as below: No future appointments.

## 2021-08-25 ENCOUNTER — Encounter: Payer: Self-pay | Admitting: Internal Medicine

## 2021-08-25 DIAGNOSIS — D709 Neutropenia, unspecified: Secondary | ICD-10-CM | POA: Insufficient documentation

## 2021-08-25 MED ORDER — METHIMAZOLE 10 MG PO TABS
20.0000 mg | ORAL_TABLET | Freq: Two times a day (BID) | ORAL | 1 refills | Status: DC
Start: 2021-08-25 — End: 2021-12-15

## 2021-12-14 ENCOUNTER — Other Ambulatory Visit: Payer: Self-pay

## 2021-12-14 ENCOUNTER — Encounter: Payer: Self-pay | Admitting: Internal Medicine

## 2021-12-14 ENCOUNTER — Ambulatory Visit (INDEPENDENT_AMBULATORY_CARE_PROVIDER_SITE_OTHER): Payer: Medicare Other | Admitting: Internal Medicine

## 2021-12-14 VITALS — BP 132/80 | HR 71 | Ht 64.0 in | Wt 190.6 lb

## 2021-12-14 DIAGNOSIS — E0590 Thyrotoxicosis, unspecified without thyrotoxic crisis or storm: Secondary | ICD-10-CM

## 2021-12-14 DIAGNOSIS — E042 Nontoxic multinodular goiter: Secondary | ICD-10-CM

## 2021-12-14 DIAGNOSIS — E0500 Thyrotoxicosis with diffuse goiter without thyrotoxic crisis or storm: Secondary | ICD-10-CM | POA: Diagnosis not present

## 2021-12-14 LAB — CBC WITH DIFFERENTIAL/PLATELET
Basophils Absolute: 0.1 10*3/uL (ref 0.0–0.1)
Basophils Relative: 1.1 % (ref 0.0–3.0)
Eosinophils Absolute: 0.2 10*3/uL (ref 0.0–0.7)
Eosinophils Relative: 2.6 % (ref 0.0–5.0)
HCT: 38.2 % (ref 36.0–46.0)
Hemoglobin: 12.2 g/dL (ref 12.0–15.0)
Lymphocytes Relative: 42.5 % (ref 12.0–46.0)
Lymphs Abs: 2.5 10*3/uL (ref 0.7–4.0)
MCHC: 32 g/dL (ref 30.0–36.0)
MCV: 84.6 fl (ref 78.0–100.0)
Monocytes Absolute: 0.5 10*3/uL (ref 0.1–1.0)
Monocytes Relative: 8.5 % (ref 3.0–12.0)
Neutro Abs: 2.6 10*3/uL (ref 1.4–7.7)
Neutrophils Relative %: 45.3 % (ref 43.0–77.0)
Platelets: 368 10*3/uL (ref 150.0–400.0)
RBC: 4.52 Mil/uL (ref 3.87–5.11)
RDW: 14.6 % (ref 11.5–15.5)
WBC: 5.8 10*3/uL (ref 4.0–10.5)

## 2021-12-14 LAB — COMPREHENSIVE METABOLIC PANEL
ALT: 17 U/L (ref 0–35)
AST: 15 U/L (ref 0–37)
Albumin: 4.5 g/dL (ref 3.5–5.2)
Alkaline Phosphatase: 49 U/L (ref 39–117)
BUN: 12 mg/dL (ref 6–23)
CO2: 27 mEq/L (ref 19–32)
Calcium: 9.9 mg/dL (ref 8.4–10.5)
Chloride: 105 mEq/L (ref 96–112)
Creatinine, Ser: 0.68 mg/dL (ref 0.40–1.20)
GFR: 89.79 mL/min (ref >60.00)
Glucose, Bld: 117 mg/dL — ABNORMAL HIGH (ref 70–99)
Potassium: 4.4 mEq/L (ref 3.5–5.1)
Sodium: 139 mEq/L (ref 135–145)
Total Bilirubin: 0.3 mg/dL (ref 0.2–1.2)
Total Protein: 7.2 g/dL (ref 6.0–8.3)

## 2021-12-14 LAB — TSH: TSH: 0.44 u[IU]/mL (ref 0.35–5.50)

## 2021-12-14 LAB — T4, FREE: Free T4: 0.67 ng/dL (ref 0.60–1.60)

## 2021-12-14 NOTE — Patient Instructions (Addendum)
We recommend that you follow these hyperthyroidism instructions at home: ? ?1) Take Methimazole daily as prescribed  ?If you develop severe sore throat with high fevers OR develop unexplained yellowing of your skin, eyes, under your tongue, severe abdominal pain with nausea or vomiting --> then please get evaluated immediately. ? ?2) Get repeat thyroid labs 8 weeks  at [facility] located at [address] [place], [place] [address] ?It is ESSENTIAL to get follow-up labs to help avoid over or undertreatment of your hyperthyroidism - both of which can be dangerous to your health. ? ? ?

## 2021-12-14 NOTE — Progress Notes (Signed)
? ?Name: Kendra Gill  ?MRN/ DOB: [id number], 1956-02-26    ?Age/ Sex: 66 y.o., female   ? ? ?PCP: Jonathon Bellows, DO   ?Reason for Endocrinology Evaluation: Graves' disease  ?   ?Initial Endocrinology Clinic Visit: 11/10/2019  ? ? ?PATIENT IDENTIFIER: Kendra Gill is a 66 y.o., female with a past medical history of Graves' Disease. She has followed with [facility] since 11/10/219 for consultative assistance with management of her Grave's Disease ? ?HISTORICAL SUMMARY:  ? ?She has been diagnosed with graves' disease in 2013, based on a thyroid uptake and scan. She was started on methimazole at that time, she has intermittent compliance due to concerns about side effects ?  ?She saw her PCP in 09/2019 and was found to have suppressed TSH < 0.001 uIU/mL with elevated FT4 2.12 and FT3 4.6. She restarted it again 09/2019  ? ?  ?No Fh of thyroid disease.  ? ? ?SUBJECTIVE:  ? ?Today (12/14/2021):  Kendra Gill is here for a follow up on hyperthyroidism . ? ?Weight has been stable  ?Denies constipation or diarrhea  ?Denies palpitations ?No local neck symptoms  ?Denies  Itching  Or buring in the eyes  ? ? ?Scheduled for an eye exam 02/20/2022 ? ?Methimazole 10 mg,2  tabs  BID  ? ? ? ?HISTORY:  ?Past Medical History:  ?Past Medical History:  ?Diagnosis Date  ? Diabetes mellitus, type II (HCC)   ? Hyperthyroidism   ? ?Past Surgical History: No past surgical history on file. ?Social History:  reports that she has never smoked. She has never used smokeless tobacco. She reports that she does not drink alcohol and does not use drugs. ?Family History:  ?Family History  ?Problem Relation Age of Onset  ? Diabetes Mother   ? Hypertension Mother   ? Hyperlipidemia Mother   ? Cancer Mother   ? Hypertension Father   ? ? ? ?HOME MEDICATIONS: ?Allergies as of 12/14/2021   ?No Known Allergies ?  ? ?  ?Medication List  ?  ? ?  ? Accurate as of December 14, 2021 10:16 AM. If you have any questions, ask your nurse or  doctor.  ?  ?  ? ?  ? ?glipiZIDE 5 MG tablet ?Commonly known as: GLUCOTROL ?Take 5 mg by mouth daily. ?  ?metFORMIN 500 MG tablet ?Commonly known as: GLUCOPHAGE ?Take 1,000 mg by mouth 2 (two) times daily with a meal. ?  ?methimazole 10 MG tablet ?Commonly known as: TAPAZOLE ?Take 2 tablets (20 mg total) by mouth 2 (two) times daily. ?  ?multivitamin tablet ?Take 1 tablet by mouth daily. ?  ?OneTouch Delica Plus Lancet33G Misc ?  ?OneTouch Verio test strip ?Generic drug: glucose blood ?  ?propranolol 20 MG tablet ?Commonly known as: INDERAL ?Take 1 tablet (20 mg total) by mouth 3 (three) times daily. This is for symptom control while definitive therapy is being completed. ?  ? ?  ? ? ? ? ?OBJECTIVE:  ? ?PHYSICAL EXAM: ?VS: BP 132/80 (BP Location: Left Arm, Patient Position: Sitting, Cuff Size: Large)   Pulse 71   Ht 5\' 4"  (1.626 m)   Wt 190 lb 9.6 oz (86.5 kg)   SpO2 98%   BMI 32.72 kg/m?  ? ? ?EXAM: ?General: Pt appears well and is in NAD  ?Neck: General: Supple without adenopathy. ?Thyroid: Thyroid size normal.  No goiter or nodules appreciated.   ?Lungs: Clear with good BS bilat with no rales, rhonchi, or  wheezes  ?Heart: Auscultation: RRR.  ?Abdomen: Normoactive bowel sounds, soft, nontender, without masses or organomegaly palpable  ?Extremities:  ?BL LE: No pretibial edema normal ROM and strength.  ?Mental Status: Judgment, insight: Intact ?Orientation: Oriented to time, place, and person ?Mood and affect: No depression, anxiety, or agitation  ? ? ? ?DATA REVIEWED: ? Latest Reference Range & Units 12/14/21 11:13  ?Sodium 135 - 145 mEq/L 139  ?Potassium 3.5 - 5.1 mEq/L 4.4  ?Chloride 96 - 112 mEq/L 105  ?CO2 19 - 32 mEq/L 27  ?Glucose 70 - 99 mg/dL 660 (H)  ?BUN 6 - 23 mg/dL 12  ?Creatinine 0.40 - 1.20 mg/dL 6.30  ?Calcium 8.4 - 10.5 mg/dL 9.9  ?Alkaline Phosphatase 39 - 117 U/L 49  ?Albumin 3.5 - 5.2 g/dL 4.5  ?AST 0 - 37 U/L 15  ?ALT 0 - 35 U/L 17  ?Total Protein 6.0 - 8.3 g/dL 7.2  ?Total Bilirubin 0.2 -  1.2 mg/dL 0.3  ?GFR >60.00 mL/min 89.79  ?WBC 4.0 - 10.5 K/uL 5.8  ?RBC 3.87 - 5.11 Mil/uL 4.52  ?Hemoglobin 12.0 - 15.0 g/dL 16.0  ?HCT 36.0 - 46.0 % 38.2  ?MCV 78.0 - 100.0 fl 84.6  ?MCHC 30.0 - 36.0 g/dL 10.9  ?RDW 11.5 - 15.5 % 14.6  ?Platelets 150.0 - 400.0 K/uL 368.0  ?Neutrophils 43.0 - 77.0 % 45.3  ?Lymphocytes 12.0 - 46.0 % 42.5  ?Monocytes Relative 3.0 - 12.0 % 8.5  ?Eosinophil 0.0 - 5.0 % 2.6  ?Basophil 0.0 - 3.0 % 1.1  ?NEUT# 1.4 - 7.7 K/uL 2.6  ?Lymphocyte # 0.7 - 4.0 K/uL 2.5  ?Monocyte # 0.1 - 1.0 K/uL 0.5  ?Eosinophils Absolute 0.0 - 0.7 K/uL 0.2  ?Basophils Absolute 0.0 - 0.1 K/uL 0.1  ?TSH 0.35 - 5.50 uIU/mL 0.44  ?T4,Free(Direct) 0.60 - 1.60 ng/dL 3.23  ?(H): Data is abnormally high ? ? ?Thyroid ultrasound 08/29/2020 ? ? ?Parenchymal Echotexture: Markedly heterogenous ?  ?Isthmus: 0.6 cm ?  ?Right lobe: 5.5 x 2.4 x 2.5 cm ?  ?Left lobe: 5.7 x 2.6 x 2.8 cm ?  ?_________________________________________________________ ?  ?Estimated total number of nodules >/= 1 cm: 0 ?  ?Number of spongiform nodules >/=  2 cm not described below (TR1): 0 ?  ?Number of mixed cystic and solid nodules >/= 1.5 cm not described ?below (TR2): 0 ?  ?_________________________________________________________ ?  ?Nodule # 1: ?  ?Location: Right; Mid ?  ?Maximum size: 0.7 cm; Other 2 dimensions: 0.6 x 0.4 cm ?  ?Composition: solid/almost completely solid (2) ?  ?Echogenicity: hypoechoic (2) ?  ?Shape: not taller-than-wide (0) ?  ?Margins: smooth (0) ?  ?Echogenic foci: none (0) ?  ?ACR TI-RADS total points: 4. ?  ?ACR TI-RADS risk category: TR4 (4-6 points). ?  ?ACR TI-RADS recommendations: ?  ?Given size (<0.9 cm) and appearance, this nodule does NOT meet ?TI-RADS criteria for biopsy or dedicated follow-up. ?  ?_________________________________________________________ ?  ?Nodule # 2: ?  ?Location: Right; Inferior ?  ?Maximum size: 0.9 cm; Other 2 dimensions: 0.70.6 cm ?  ?Composition: solid/almost completely solid (2) ?   ?Echogenicity: hypoechoic (2) ?  ?Shape: not taller-than-wide (0) ?  ?Margins: ill-defined (0) ?  ?Echogenic foci: none (0) ?  ?ACR TI-RADS total points: 4. ?  ?ACR TI-RADS risk category: TR4 (4-6 points). ?  ?ACR TI-RADS recommendations: ?  ?Given size (<0.9 cm) and appearance, this nodule does NOT meet ?TI-RADS criteria for biopsy or dedicated follow-up. ?  ?_________________________________________________________ ?  ?  Nodule # 3: ?  ?Location: Left; Mid ?  ?Maximum size: 0.9 cm; Other 2 dimensions: 0.7 x 0.4 cm ?  ?Composition: solid/almost completely solid (2) ?  ?Echogenicity: hypoechoic (2) ?  ?Shape: not taller-than-wide (0) ?  ?Margins: ill-defined (0) ?  ?Echogenic foci: none (0) ?  ?ACR TI-RADS total points: 4. ?  ?ACR TI-RADS risk category: TR4 (4-6 points). ?  ?ACR TI-RADS recommendations: ?  ?Given size (<0.9 cm) and appearance, this nodule does NOT meet ?TI-RADS criteria for biopsy or dedicated follow-up. ?  ?_________________________________________________________ ?  ?Nodule # 4: ?  ?Location: Left; Inferior ?  ?Maximum size: 0.7 cm; Other 2 dimensions: 0.7 x 0.5 cm ?  ?Composition: solid/almost completely solid (2) ?  ?Echogenicity: hypoechoic (2) ?  ?Shape: not taller-than-wide (0) ?  ?Margins: ill-defined (0) ?  ?Echogenic foci: none (0) ?  ?ACR TI-RADS total points: 4. ?  ?ACR TI-RADS risk category: TR4 (4-6 points). ?  ?ACR TI-RADS recommendations: ?  ?Given size (<0.9 cm) and appearance, this nodule does NOT meet ?TI-RADS criteria for biopsy or dedicated follow-up. ?  ?_________________________________________________________ ?  ?IMPRESSION: ?1. Heterogeneously enlarged thyroid compatible with multinodular ?goiter. ?2. Each of the observed thyroid nodules have benign sonographic ?characteristics and do not warrant additional ultrasound follow-up ?or tissue sampling. ? ?ASSESSMENT / PLAN / RECOMMENDATIONS:  ? ?Hyperthyroidism secondary to Graves' disease ? ?- Pt is clinically euthyroid  ?-  Denies local neck symptoms ?-Her TFTs have normalized but continues to require higher doses of methimazole. ? ?- We again discussed  the possible side effects/complications of anti-thyroid drug Tx (specifically detailin

## 2021-12-15 MED ORDER — METHIMAZOLE 10 MG PO TABS: 20.0000 mg | ORAL_TABLET | Freq: Two times a day (BID) | ORAL | 2 refills | Status: DC

## 2022-02-04 IMAGING — US US THYROID
1 series · 12 of 25 positions shown · non-contrast
Comparison: None.

CLINICAL DATA: Other. 64-year-old female with history of Graves
disease.

EXAM:
THYROID ULTRASOUND
TECHNIQUE: Ultrasound examination of the thyroid gland and adjacent soft
tissues was performed.

[Series 1: us thyroid · 0.07mm/px · 12 of 51 slices shown]
[im 3/51]
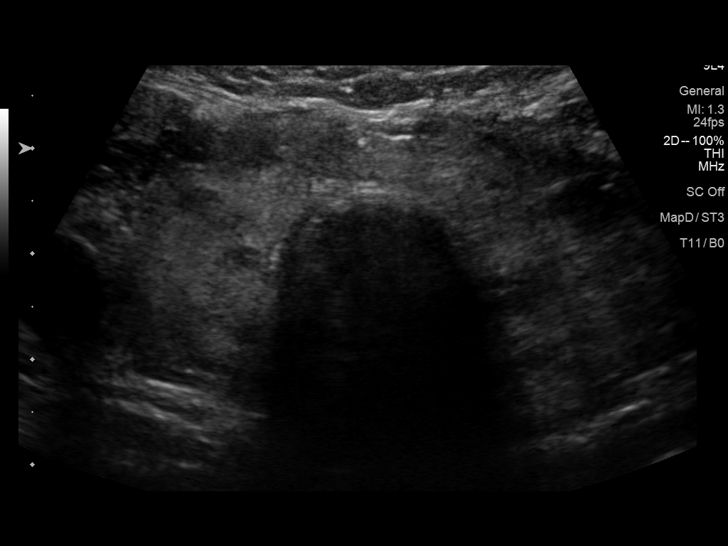
[im 7/51]
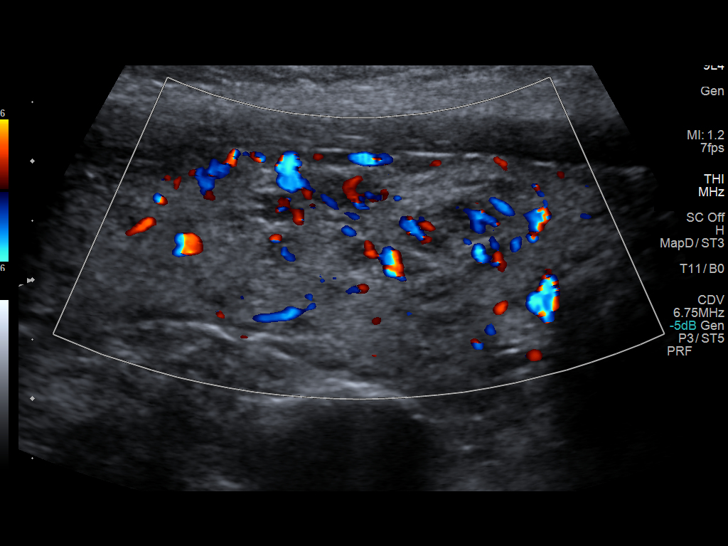
[im 11/51]
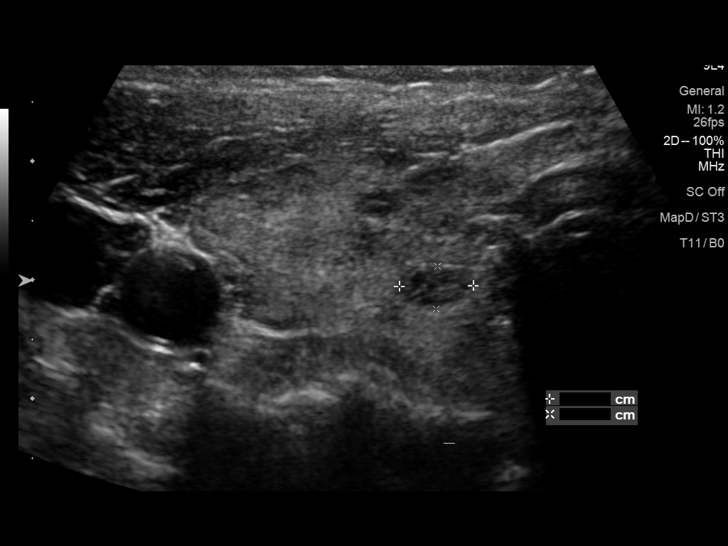
[im 15/51]
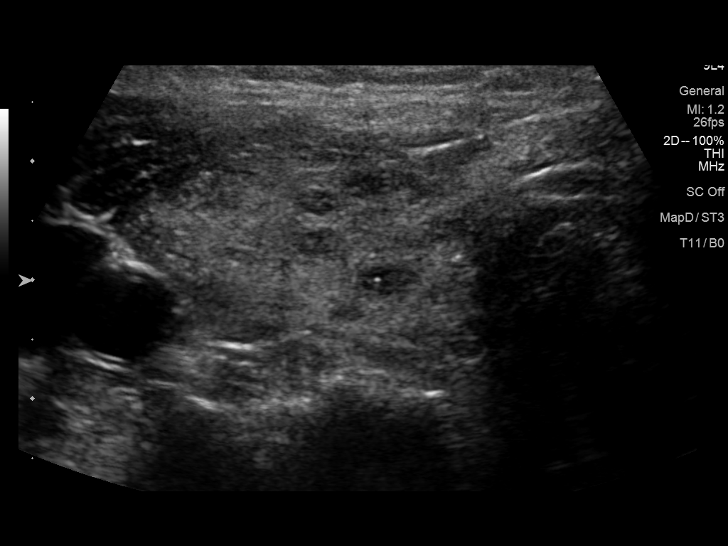
[im 19/51]
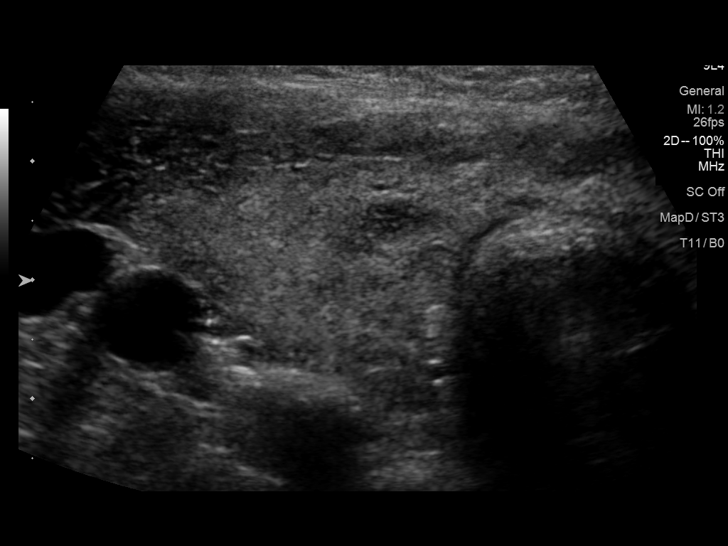
[im 23/51]
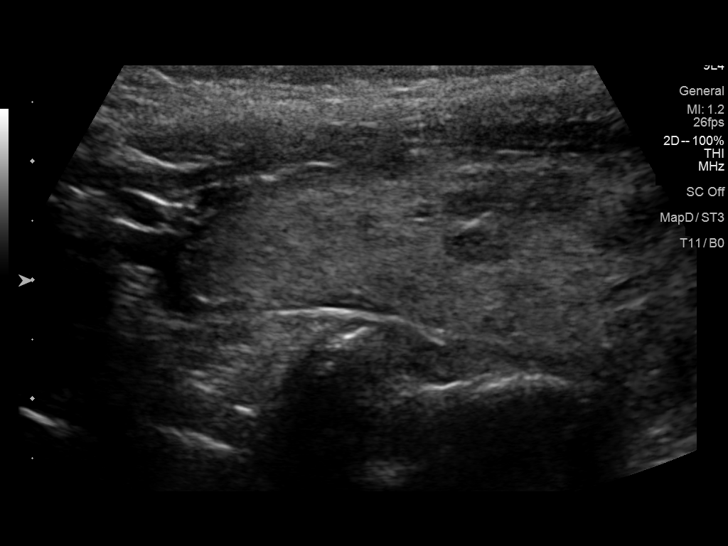
[im 28/51]
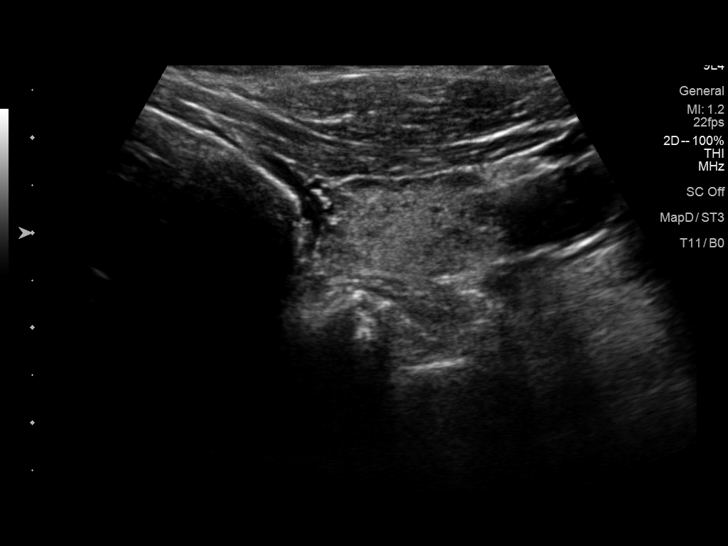
[im 32/51]
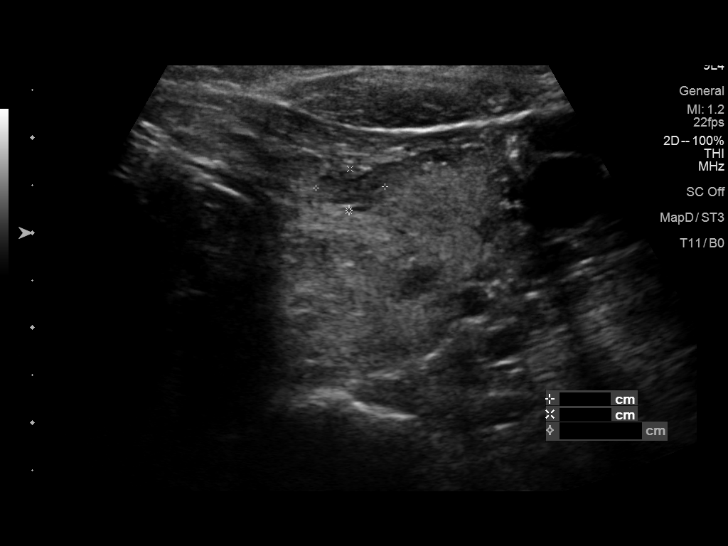
[im 36/51]
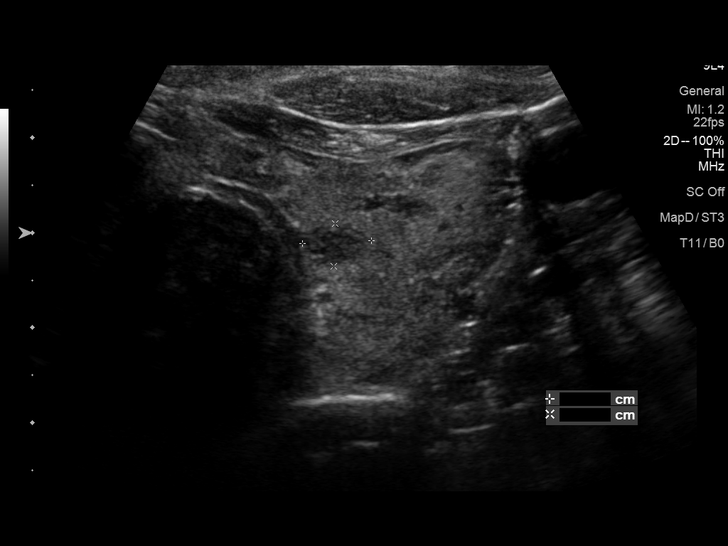
[im 40/51]
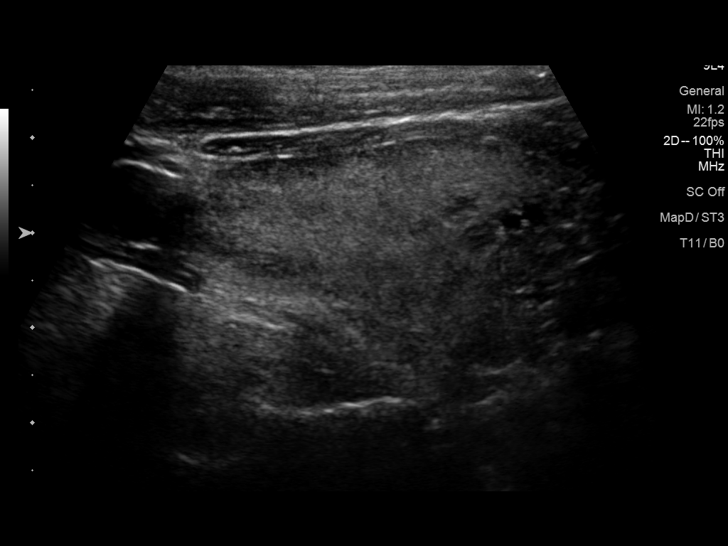
[im 44/51]
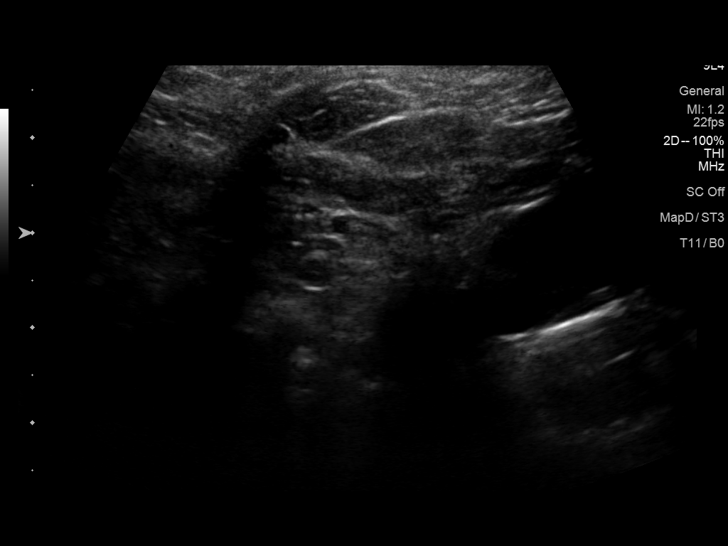
[im 48/51]
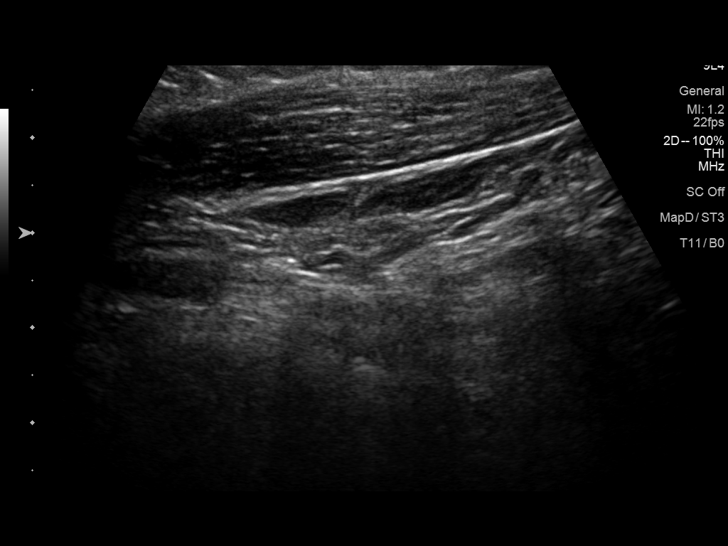

[12 of 25 positions shown; findings below may reference images not displayed]

FINDINGS: Parenchymal Echotexture: Markedly heterogenous

Isthmus: 0.6 cm

Right lobe: 5.5 x 2.4 x 2.5 cm

Left lobe: 5.7 x 2.6 x 2.8 cm

_________________________________________________________

Estimated total number of nodules >/= 1 cm: 0

Number of spongiform nodules >/=  2 cm not described below (TR1): 0

Number of mixed cystic and solid nodules >/= 1.5 cm not described
below (TR2): 0

_________________________________________________________

Nodule # 1:

Location: Right; Mid

Maximum size: 0.7 cm; Other 2 dimensions: 0.6 x 0.4 cm

Composition: solid/almost completely solid (2)

Echogenicity: hypoechoic (2)

Shape: not taller-than-wide (0)

Margins: smooth (0)

Echogenic foci: none (0)

ACR TI-RADS total points: 4.

ACR TI-RADS risk category: TR4 (4-6 points).

ACR TI-RADS recommendations:

Given size (<0.9 cm) and appearance, this nodule does NOT meet
TI-RADS criteria for biopsy or dedicated follow-up.

_________________________________________________________

Nodule # 2:

Location: Right; Inferior

Maximum size: 0.9 cm; Other 2 dimensions: 0.70.6 cm

Composition: solid/almost completely solid (2)

Echogenicity: hypoechoic (2)

Shape: not taller-than-wide (0)

Margins: ill-defined (0)

Echogenic foci: none (0)

ACR TI-RADS total points: 4.

ACR TI-RADS risk category: TR4 (4-6 points).

ACR TI-RADS recommendations:

Given size (<0.9 cm) and appearance, this nodule does NOT meet
TI-RADS criteria for biopsy or dedicated follow-up.

_________________________________________________________

Nodule # 3:

Location: Left; Mid

Maximum size: 0.9 cm; Other 2 dimensions: 0.7 x 0.4 cm

Composition: solid/almost completely solid (2)

Echogenicity: hypoechoic (2)

Shape: not taller-than-wide (0)

Margins: ill-defined (0)

Echogenic foci: none (0)

ACR TI-RADS total points: 4.

ACR TI-RADS risk category: TR4 (4-6 points).

ACR TI-RADS recommendations:

Given size (<0.9 cm) and appearance, this nodule does NOT meet
TI-RADS criteria for biopsy or dedicated follow-up.

_________________________________________________________

Nodule # 4:

Location: Left; Inferior

Maximum size: 0.7 cm; Other 2 dimensions: 0.7 x 0.5 cm

Composition: solid/almost completely solid (2)

Echogenicity: hypoechoic (2)

Shape: not taller-than-wide (0)

Margins: ill-defined (0)

Echogenic foci: none (0)

ACR TI-RADS total points: 4.

ACR TI-RADS risk category: TR4 (4-6 points).

ACR TI-RADS recommendations:

Given size (<0.9 cm) and appearance, this nodule does NOT meet
TI-RADS criteria for biopsy or dedicated follow-up.

_________________________________________________________
IMPRESSION: 1. Heterogeneously enlarged thyroid compatible with multinodular
goiter.
2. Each of the observed thyroid nodules have benign sonographic
characteristics and do not warrant additional ultrasound follow-up
or tissue sampling.

The above is in keeping with the ACR TI-RADS recommendations - [HOSPITAL] 5596;[DATE].

## 2022-03-23 ENCOUNTER — Other Ambulatory Visit: Payer: Self-pay | Admitting: Internal Medicine

## 2022-03-24 LAB — T4, FREE: Free T4: 1.61 ng/dL (ref 0.82–1.77)

## 2022-03-24 LAB — TSH: TSH: 0.085 u[IU]/mL — ABNORMAL LOW (ref 0.450–4.500)

## 2022-03-27 ENCOUNTER — Other Ambulatory Visit: Payer: Self-pay | Admitting: Internal Medicine

## 2022-03-27 MED ORDER — METHIMAZOLE 10 MG PO TABS: ORAL_TABLET | ORAL | 6 refills | Status: DC

## 2022-06-21 ENCOUNTER — Encounter: Payer: Self-pay | Admitting: Internal Medicine

## 2022-06-21 ENCOUNTER — Ambulatory Visit (INDEPENDENT_AMBULATORY_CARE_PROVIDER_SITE_OTHER): Payer: Medicare Other | Admitting: Internal Medicine

## 2022-06-21 VITALS — BP 150/84 | HR 67 | Ht 64.0 in | Wt 193.2 lb

## 2022-06-21 DIAGNOSIS — E0590 Thyrotoxicosis, unspecified without thyrotoxic crisis or storm: Secondary | ICD-10-CM | POA: Diagnosis not present

## 2022-06-21 DIAGNOSIS — E0500 Thyrotoxicosis with diffuse goiter without thyrotoxic crisis or storm: Secondary | ICD-10-CM | POA: Diagnosis not present

## 2022-06-21 DIAGNOSIS — R030 Elevated blood-pressure reading, without diagnosis of hypertension: Secondary | ICD-10-CM | POA: Diagnosis not present

## 2022-06-21 LAB — COMPREHENSIVE METABOLIC PANEL
ALT: 16 U/L (ref 0–35)
AST: 13 U/L (ref 0–37)
Albumin: 4.3 g/dL (ref 3.5–5.2)
Alkaline Phosphatase: 50 U/L (ref 39–117)
BUN: 10 mg/dL (ref 6–23)
CO2: 25 mEq/L (ref 19–32)
Calcium: 9.4 mg/dL (ref 8.4–10.5)
Chloride: 106 mEq/L (ref 96–112)
Creatinine, Ser: 0.82 mg/dL (ref 0.40–1.20)
GFR: 73.48 mL/min (ref >60.00)
Glucose, Bld: 145 mg/dL — ABNORMAL HIGH (ref 70–99)
Potassium: 4.1 mEq/L (ref 3.5–5.1)
Sodium: 139 mEq/L (ref 135–145)
Total Bilirubin: 0.3 mg/dL (ref 0.2–1.2)
Total Protein: 7.6 g/dL (ref 6.0–8.3)

## 2022-06-21 LAB — TSH: TSH: 0.45 u[IU]/mL (ref 0.35–5.50)

## 2022-06-21 LAB — T4, FREE: Free T4: 0.77 ng/dL (ref 0.60–1.60)

## 2022-06-21 MED ORDER — METHIMAZOLE 10 MG PO TABS: 20.0000 mg | ORAL_TABLET | Freq: Two times a day (BID) | ORAL | 11 refills | Status: DC

## 2022-06-21 NOTE — Progress Notes (Signed)
Name: Kendra Gill  MRN/ DOB: [id number], 08/31/1956    Age/ Sex: 66 y.o., female     PCP: Jonathon Bellows, DO   Reason for Endocrinology Evaluation: Graves' disease     Initial Endocrinology Clinic Visit: 11/10/2019    PATIENT IDENTIFIER: [name] is a 66 y.o., female with a past medical history of Graves' Disease. She has followed with [facility] since 11/10/219 for consultative assistance with management of her Grave's Disease  HISTORICAL SUMMARY:   She has been diagnosed with graves' disease in 2013, based on a thyroid uptake and scan. She was started on methimazole at that time, she has intermittent compliance due to concerns about side effects   She saw her PCP in 09/2019 and was found to have suppressed TSH < 0.001 uIU/mL with elevated FT4 2.12 and FT3 4.6. She restarted it again 09/2019     No Fh of thyroid disease.    SUBJECTIVE:   Today (06/21/2022):  [name] is here for a follow up on hyperthyroidism .  Weight has gradually increased  Denies constipation or diarrhea  Denies palpitations No local neck symptoms  Denies  Itching  Or buring in the eyes    Last eye exam 02/20/2022    Methimazole 10 mg,2  tabs  BID     HISTORY:  Past Medical History:  Past Medical History:  Diagnosis Date   Diabetes mellitus, type II (HCC)    Hyperthyroidism    Past Surgical History: No past surgical history on file. Social History:  reports that she has never smoked. She has never used smokeless tobacco. She reports that she does not drink alcohol and does not use drugs. Family History:  Family History  Problem Relation Age of Onset   Diabetes Mother    Hypertension Mother    Hyperlipidemia Mother    Cancer Mother    Hypertension Father      HOME MEDICATIONS: Allergies as of 06/21/2022       Reactions   Other Other (See Comments)        Medication List        Accurate as of June 21, 2022 11:07 AM. If you have any  questions, ask your nurse or doctor.          STOP taking these medications    propranolol 20 MG tablet Commonly known as: INDERAL Stopped by: Scarlette Shorts, MD       TAKE these medications    glipiZIDE 5 MG tablet Commonly known as: GLUCOTROL Take 5 mg by mouth daily.   metFORMIN 500 MG tablet Commonly known as: GLUCOPHAGE Take 1,000 mg by mouth 2 (two) times daily with a meal.   methimazole 10 MG tablet Commonly known as: TAPAZOLE Take 3 tablets (30 mg total) by mouth daily with breakfast AND 2 tablets (20 mg total) daily with supper.   multivitamin tablet Take 1 tablet by mouth daily.   OneTouch Delica Plus Lancet33G Misc   OneTouch Verio test strip Generic drug: glucose blood          OBJECTIVE:   PHYSICAL EXAM: VS: BP (!) 150/84   Pulse 67   Ht 5\' 4"  (1.626 m)   Wt 193 lb 3.2 oz (87.6 kg)   SpO2 98%   BMI 33.16 kg/m    EXAM: General: Pt appears well and is in NAD  Neck: General: Supple without adenopathy. Thyroid: Thyroid size normal.  No goiter or nodules appreciated.   Lungs: Clear  with good BS bilat with no rales, rhonchi, or wheezes  Heart: Auscultation: RRR.  Abdomen: Normoactive bowel sounds, soft, nontender, without masses or organomegaly palpable  Extremities:  BL [id number]  pretibial edema  Mental Status: Judgment, insight: Intact Orientation: Oriented to time, place, and person Mood and affect: No depression, anxiety, or agitation     DATA REVIEWED:   Latest Reference Range & Units 06/21/22 11:34  Sodium 135 - 145 mEq/L 139  Potassium 3.5 - 5.1 mEq/L 4.1  Chloride 96 - 112 mEq/L 106  CO2 19 - 32 mEq/L 25  Glucose 70 - 99 mg/dL 145 (H)  BUN 6 - 23 mg/dL 10  Creatinine 0.40 - 1.20 mg/dL 0.82  Calcium 8.4 - 10.5 mg/dL 9.4  Alkaline Phosphatase 39 - 117 U/L 50  Albumin 3.5 - 5.2 g/dL 4.3  AST 0 - 37 U/L 13  ALT 0 - 35 U/L 16  Total Protein 6.0 - 8.3 g/dL 7.6  Total Bilirubin 0.2 - 1.2 mg/dL 0.3  GFR >60.00 mL/min  73.48      Latest Reference Range & Units 06/21/22 11:34  Glucose 70 - 99 mg/dL 145 (H)  TSH 0.35 - 5.50 uIU/mL 0.45  T4,Free(Direct) 0.60 - 1.60 ng/dL 0.77     Thyroid ultrasound 08/29/2020   Parenchymal Echotexture: Markedly heterogenous   Isthmus: 0.6 cm   Right lobe: 5.5 x 2.4 x 2.5 cm   Left lobe: 5.7 x 2.6 x 2.8 cm   _________________________________________________________   Estimated total number of nodules >/= 1 cm: 0   Number of spongiform nodules >/=  2 cm not described below (TR1): 0   Number of mixed cystic and solid nodules >/= 1.5 cm not described below (TR2): 0   _________________________________________________________   Nodule # 1:   Location: Right; Mid   Maximum size: 0.7 cm; Other 2 dimensions: 0.6 x 0.4 cm   Composition: solid/almost completely solid (2)   Echogenicity: hypoechoic (2)   Shape: not taller-than-wide (0)   Margins: smooth (0)   Echogenic foci: none (0)   ACR TI-RADS total points: 4.   ACR TI-RADS risk category: TR4 (4-6 points).   ACR TI-RADS recommendations:   Given size (<0.9 cm) and appearance, this nodule does NOT meet TI-RADS criteria for biopsy or dedicated follow-up.   _________________________________________________________   Nodule # 2:   Location: Right; Inferior   Maximum size: 0.9 cm; Other 2 dimensions: 0.70.6 cm   Composition: solid/almost completely solid (2)   Echogenicity: hypoechoic (2)   Shape: not taller-than-wide (0)   Margins: ill-defined (0)   Echogenic foci: none (0)   ACR TI-RADS total points: 4.   ACR TI-RADS risk category: TR4 (4-6 points).   ACR TI-RADS recommendations:   Given size (<0.9 cm) and appearance, this nodule does NOT meet TI-RADS criteria for biopsy or dedicated follow-up.   _________________________________________________________   Nodule # 3:   Location: Left; Mid   Maximum size: 0.9 cm; Other 2 dimensions: 0.7 x 0.4 cm   Composition: solid/almost  completely solid (2)   Echogenicity: hypoechoic (2)   Shape: not taller-than-wide (0)   Margins: ill-defined (0)   Echogenic foci: none (0)   ACR TI-RADS total points: 4.   ACR TI-RADS risk category: TR4 (4-6 points).   ACR TI-RADS recommendations:   Given size (<0.9 cm) and appearance, this nodule does NOT meet TI-RADS criteria for biopsy or dedicated follow-up.   _________________________________________________________   Nodule # 4:   Location: Left; Inferior   Maximum size:  0.7 cm; Other 2 dimensions: 0.7 x 0.5 cm   Composition: solid/almost completely solid (2)   Echogenicity: hypoechoic (2)   Shape: not taller-than-wide (0)   Margins: ill-defined (0)   Echogenic foci: none (0)   ACR TI-RADS total points: 4.   ACR TI-RADS risk category: TR4 (4-6 points).   ACR TI-RADS recommendations:   Given size (<0.9 cm) and appearance, this nodule does NOT meet TI-RADS criteria for biopsy or dedicated follow-up.   _________________________________________________________   IMPRESSION: 1. Heterogeneously enlarged thyroid compatible with multinodular goiter. 2. Each of the observed thyroid nodules have benign sonographic characteristics and do not warrant additional ultrasound follow-up or tissue sampling.  ASSESSMENT / PLAN / RECOMMENDATIONS:   Hyperthyroidism secondary to Graves' disease  - Pt is clinically euthyroid  - Denies local neck symptoms -Her TFTs have normalized but continues to require higher doses of methimazole. -Patient declines any other intervention besides continuing thionamides therapy at this time     Medications  Continue methimazole 10 mg, 2 tablets BID    2. Graves' Disease:    - No extra thyroidal manifestations of graves' disease     3. Elevated Blood pressure:  - initial BP 158/84 mmhg, repeat 150/84  - She has no prior dx of HTN - Counseled about low salt diet especially with LE edema evident on toay's exam - She has  a F/U with PCP 10/12th , will recheck than      F/U in 6 months    Signed electronically by: Lyndle Herrlich, MD  [facility]  [facility] [address] [place] [place], [place] [address] Phone: [phone] FAX: [phone]      CC: Jonathon Bellows, DO [address] [place] [address] Phone: [phone]  Fax: [phone]   Return to Endocrinology clinic as below: No future appointments.

## 2022-06-22 LAB — T3: T3, Total: 106 ng/dL (ref 76–181)

## 2022-11-06 ENCOUNTER — Telehealth: Payer: Self-pay | Admitting: Internal Medicine

## 2022-11-06 DIAGNOSIS — E0500 Thyrotoxicosis with diffuse goiter without thyrotoxic crisis or storm: Secondary | ICD-10-CM

## 2022-11-06 MED ORDER — METHIMAZOLE 10 MG PO TABS: 20.0000 mg | ORAL_TABLET | Freq: Two times a day (BID) | ORAL | 1 refills | Status: DC

## 2022-11-06 NOTE — Telephone Encounter (Signed)
Rx sent 

## 2022-11-06 NOTE — Telephone Encounter (Signed)
MEDICATION: methimazole methimazole (TAPAZOLE) 10 MG tablet  PHARMACY:  [place] [address] [phone] HAS THE PATIENT CONTACTED THEIR PHARMACY? No  IS THIS A 90 DAY SUPPLY : Yes  IS PATIENT OUT OF MEDICATION: No  IF NOT; HOW MUCH IS LEFT: 3 Days  LAST APPOINTMENT DATE: @9$ /28/2023  NEXT APPOINTMENT DATE:@4$ /12/2022  DO WE HAVE YOUR PERMISSION TO LEAVE A DETAILED MESSAGE?: Yes  OTHER COMMENTS: Due to insurance change patient has had to change pharmacies.   **Let patient know to contact pharmacy at the end of the day to make sure medication is ready. **  ** Please notify patient to allow 48-72 hours to process**  **Encourage patient to contact the pharmacy for refills or they can request refills through [facility]**

## 2022-11-28 ENCOUNTER — Ambulatory Visit: Payer: Medicare Other | Admitting: Internal Medicine

## 2022-12-17 NOTE — Progress Notes (Deleted)
Name: Kendra Gill  MRN/ DOB: [id number], [birth year]    Age/ Sex: 67 y.o., female     PCP: Sherrilee Gilles, DO   Reason for Endocrinology Evaluation: Graves' disease     Initial Endocrinology Clinic Visit: 11/10/2019    PATIENT IDENTIFIER: Kendra Gill is a 67 y.o., female with a past medical history of Graves' Disease. She has followed with [facility] since 11/10/219 for consultative assistance with management of her Grave's Disease  HISTORICAL SUMMARY:   She has been diagnosed with graves' disease in 2013, based on a thyroid uptake and scan. She was started on methimazole at that time, she has intermittent compliance due to concerns about side effects   She saw her PCP in 09/2019 and was found to have suppressed TSH < 0.001 uIU/mL with elevated FT4 2.12 and FT3 4.6. She restarted it again 09/2019     No Fh of thyroid disease.    SUBJECTIVE:   Today (12/17/2022):  Kendra Gill is here for a follow up on hyperthyroidism .  Weight has gradually increased  Denies constipation or diarrhea  Denies palpitations No local neck symptoms  Denies  Itching  Or buring in the eyes    Last eye exam 02/20/2022    Methimazole 10 mg,2  tabs  BID     HISTORY:  Past Medical History:  Past Medical History:  Diagnosis Date   Diabetes mellitus, type II ([place])    Hyperthyroidism    Past Surgical History: No past surgical history on file. Social History:  reports that she has never smoked. She has never used smokeless tobacco. She reports that she does not drink alcohol and does not use drugs. Family History:  Family History  Problem Relation Age of Onset   Diabetes Mother    Hypertension Mother    Hyperlipidemia Mother    Cancer Mother    Hypertension Father      HOME MEDICATIONS: Allergies as of 12/18/2022       Reactions   Other Other (See Comments)        Medication List        Accurate as of December 17, 2022  9:19 AM. If you have any  questions, ask your nurse or doctor.          glipiZIDE 5 MG tablet Commonly known as: GLUCOTROL Take 5 mg by mouth daily.   metFORMIN 500 MG tablet Commonly known as: GLUCOPHAGE Take 1,000 mg by mouth 2 (two) times daily with a meal.   methimazole 10 MG tablet Commonly known as: TAPAZOLE Take 2 tablets (20 mg total) by mouth 2 (two) times daily.   multivitamin tablet Take 1 tablet by mouth daily.   OneTouch Delica Plus 0000000 Misc   OneTouch Verio test strip Generic drug: glucose blood          OBJECTIVE:   PHYSICAL EXAM: VS: There were no vitals taken for this visit.   EXAM: General: Pt appears well and is in NAD  Neck: General: Supple without adenopathy. Thyroid: Thyroid size normal.  No goiter or nodules appreciated.   Lungs: Clear with good BS bilat with no rales, rhonchi, or wheezes  Heart: Auscultation: RRR.  Abdomen: Normoactive bowel sounds, soft, nontender, without masses or organomegaly palpable  Extremities:  BL [id number]  pretibial edema  Mental Status: Judgment, insight: Intact Orientation: Oriented to time, place, and person Mood and affect: No depression, anxiety, or agitation     DATA REVIEWED:   Latest Reference  Range & Units 06/21/22 11:34  Sodium 135 - 145 mEq/L 139  Potassium 3.5 - 5.1 mEq/L 4.1  Chloride 96 - 112 mEq/L 106  CO2 19 - 32 mEq/L 25  Glucose 70 - 99 mg/dL 145 (H)  BUN 6 - 23 mg/dL 10  Creatinine 0.40 - 1.20 mg/dL 0.82  Calcium 8.4 - 10.5 mg/dL 9.4  Alkaline Phosphatase 39 - 117 U/L 50  Albumin 3.5 - 5.2 g/dL 4.3  AST 0 - 37 U/L 13  ALT 0 - 35 U/L 16  Total Protein 6.0 - 8.3 g/dL 7.6  Total Bilirubin 0.2 - 1.2 mg/dL 0.3  GFR >60.00 mL/min 73.48      Latest Reference Range & Units 06/21/22 11:34  Glucose 70 - 99 mg/dL 145 (H)  TSH 0.35 - 5.50 uIU/mL 0.45  T4,Free(Direct) 0.60 - 1.60 ng/dL 0.77     Thyroid ultrasound 08/29/2020   Parenchymal Echotexture: Markedly heterogenous   Isthmus: 0.6 cm    Right lobe: 5.5 x 2.4 x 2.5 cm   Left lobe: 5.7 x 2.6 x 2.8 cm   _________________________________________________________   Estimated total number of nodules >/= 1 cm: 0   Number of spongiform nodules >/=  2 cm not described below (TR1): 0   Number of mixed cystic and solid nodules >/= 1.5 cm not described below (TR2): 0   _________________________________________________________   Nodule # 1:   Location: Right; Mid   Maximum size: 0.7 cm; Other 2 dimensions: 0.6 x 0.4 cm   Composition: solid/almost completely solid (2)   Echogenicity: hypoechoic (2)   Shape: not taller-than-wide (0)   Margins: smooth (0)   Echogenic foci: none (0)   ACR TI-RADS total points: 4.   ACR TI-RADS risk category: TR4 (4-6 points).   ACR TI-RADS recommendations:   Given size (<0.9 cm) and appearance, this nodule does NOT meet TI-RADS criteria for biopsy or dedicated follow-up.   _________________________________________________________   Nodule # 2:   Location: Right; Inferior   Maximum size: 0.9 cm; Other 2 dimensions: 0.70.6 cm   Composition: solid/almost completely solid (2)   Echogenicity: hypoechoic (2)   Shape: not taller-than-wide (0)   Margins: ill-defined (0)   Echogenic foci: none (0)   ACR TI-RADS total points: 4.   ACR TI-RADS risk category: TR4 (4-6 points).   ACR TI-RADS recommendations:   Given size (<0.9 cm) and appearance, this nodule does NOT meet TI-RADS criteria for biopsy or dedicated follow-up.   _________________________________________________________   Nodule # 3:   Location: Left; Mid   Maximum size: 0.9 cm; Other 2 dimensions: 0.7 x 0.4 cm   Composition: solid/almost completely solid (2)   Echogenicity: hypoechoic (2)   Shape: not taller-than-wide (0)   Margins: ill-defined (0)   Echogenic foci: none (0)   ACR TI-RADS total points: 4.   ACR TI-RADS risk category: TR4 (4-6 points).   ACR TI-RADS recommendations:   Given  size (<0.9 cm) and appearance, this nodule does NOT meet TI-RADS criteria for biopsy or dedicated follow-up.   _________________________________________________________   Nodule # 4:   Location: Left; Inferior   Maximum size: 0.7 cm; Other 2 dimensions: 0.7 x 0.5 cm   Composition: solid/almost completely solid (2)   Echogenicity: hypoechoic (2)   Shape: not taller-than-wide (0)   Margins: ill-defined (0)   Echogenic foci: none (0)   ACR TI-RADS total points: 4.   ACR TI-RADS risk category: TR4 (4-6 points).   ACR TI-RADS recommendations:   Given size (<0.9 cm)  and appearance, this nodule does NOT meet TI-RADS criteria for biopsy or dedicated follow-up.   _________________________________________________________   IMPRESSION: 1. Heterogeneously enlarged thyroid compatible with multinodular goiter. 2. Each of the observed thyroid nodules have benign sonographic characteristics and do not warrant additional ultrasound follow-up or tissue sampling.  ASSESSMENT / PLAN / RECOMMENDATIONS:   Hyperthyroidism secondary to Graves' disease  - Pt is clinically euthyroid  - Denies local neck symptoms -Her TFTs have normalized but continues to require higher doses of methimazole. -Patient declines any other intervention besides continuing thionamides therapy at this time     Medications  Continue methimazole 10 mg, 2 tablets BID    2. Graves' Disease:    - No extra thyroidal manifestations of graves' disease     F/U in 6 months    Signed electronically by: Mack Guise, MD  [facility]  [facility] [address] [place], [place] [address] Phone: [phone] FAX: [phone]      CC: Sherrilee Gilles, [place] [address] Phone: [phone]  Fax: [phone]   Return to Endocrinology clinic as below: Future Appointments  Date Time Provider [place]  12/18/2022  8:10 AM  Kendra Gill, Melanie Crazier, MD [facility] None

## 2022-12-18 ENCOUNTER — Ambulatory Visit: Payer: Medicare Other | Admitting: Internal Medicine

## 2022-12-27 ENCOUNTER — Ambulatory Visit: Payer: Medicare Other | Admitting: Internal Medicine

## 2023-03-01 ENCOUNTER — Ambulatory Visit: Payer: Medicare Other | Admitting: Internal Medicine

## 2023-05-14 ENCOUNTER — Other Ambulatory Visit: Payer: Self-pay | Admitting: Internal Medicine

## 2023-05-14 DIAGNOSIS — E0500 Thyrotoxicosis with diffuse goiter without thyrotoxic crisis or storm: Secondary | ICD-10-CM

## 2023-08-16 ENCOUNTER — Ambulatory Visit (INDEPENDENT_AMBULATORY_CARE_PROVIDER_SITE_OTHER): Payer: Medicare Other | Admitting: Internal Medicine

## 2023-08-16 ENCOUNTER — Encounter: Payer: Self-pay | Admitting: Internal Medicine

## 2023-08-16 ENCOUNTER — Telehealth: Payer: Self-pay | Admitting: Internal Medicine

## 2023-08-16 VITALS — BP 126/82 | HR 74 | Ht 64.0 in | Wt 189.0 lb

## 2023-08-16 DIAGNOSIS — E0590 Thyrotoxicosis, unspecified without thyrotoxic crisis or storm: Secondary | ICD-10-CM | POA: Diagnosis not present

## 2023-08-16 DIAGNOSIS — E0500 Thyrotoxicosis with diffuse goiter without thyrotoxic crisis or storm: Secondary | ICD-10-CM | POA: Diagnosis not present

## 2023-08-16 LAB — COMPREHENSIVE METABOLIC PANEL
ALT: 18 U/L (ref 0–35)
AST: 15 U/L (ref 0–37)
Albumin: 4.3 g/dL (ref 3.5–5.2)
Alkaline Phosphatase: 66 U/L (ref 39–117)
BUN: 11 mg/dL (ref 6–23)
CO2: 25 meq/L (ref 19–32)
Calcium: 9.4 mg/dL (ref 8.4–10.5)
Chloride: 102 meq/L (ref 96–112)
Creatinine, Ser: 0.77 mg/dL (ref 0.40–1.20)
GFR: 78.6 mL/min (ref >60.00)
Glucose, Bld: 262 mg/dL — ABNORMAL HIGH (ref 70–99)
Potassium: 4.4 meq/L (ref 3.5–5.1)
Sodium: 135 meq/L (ref 135–145)
Total Bilirubin: 0.3 mg/dL (ref 0.2–1.2)
Total Protein: 7.4 g/dL (ref 6.0–8.3)

## 2023-08-16 LAB — CBC WITH DIFFERENTIAL/PLATELET
Basophils Absolute: 0 10*3/uL (ref 0.0–0.1)
Basophils Relative: 0.8 % (ref 0.0–3.0)
Eosinophils Absolute: 0.1 10*3/uL (ref 0.0–0.7)
Eosinophils Relative: 1.5 % (ref 0.0–5.0)
HCT: 40.9 % (ref 36.0–46.0)
Hemoglobin: 13 g/dL (ref 12.0–15.0)
Lymphocytes Relative: 40.6 % (ref 12.0–46.0)
Lymphs Abs: 2.2 10*3/uL (ref 0.7–4.0)
MCHC: 31.8 g/dL (ref 30.0–36.0)
MCV: 87.2 fL (ref 78.0–100.0)
Monocytes Absolute: 0.4 10*3/uL (ref 0.1–1.0)
Monocytes Relative: 6.8 % (ref 3.0–12.0)
Neutro Abs: 2.8 10*3/uL (ref 1.4–7.7)
Neutrophils Relative %: 50.3 % (ref 43.0–77.0)
Platelets: 242 10*3/uL (ref 150.0–400.0)
RBC: 4.69 Mil/uL (ref 3.87–5.11)
RDW: 13.5 % (ref 11.5–15.5)
WBC: 5.5 10*3/uL (ref 4.0–10.5)

## 2023-08-16 LAB — TSH: TSH: 0.16 u[IU]/mL — ABNORMAL LOW (ref 0.35–5.50)

## 2023-08-16 LAB — T4, FREE: Free T4: 0.96 ng/dL (ref 0.60–1.60)

## 2023-08-16 MED ORDER — METHIMAZOLE 10 MG PO TABS: ORAL_TABLET | ORAL | 11 refills | Status: DC

## 2023-08-16 NOTE — Telephone Encounter (Signed)
Please let the patient know that her thyroid is overactive, she needs to increase methimazole to 3 tablets in the morning and continue to in the evening     Thanks

## 2023-08-16 NOTE — Progress Notes (Signed)
Name: Kendra Gill  MRN/ DOB: [id number], 05-Feb-1956    Age/ Sex: 67 y.o., female     PCP: Jonathon Bellows, DO   Reason for Endocrinology Evaluation: Graves' disease     Initial Endocrinology Clinic Visit: 11/10/2019    PATIENT IDENTIFIER: Kendra Gill is a 67 y.o., female with a past medical history of Graves' Disease. She has followed with [facility] since 11/10/219 for consultative assistance with management of her Grave's Disease  HISTORICAL SUMMARY:   She has been diagnosed with graves' disease in 2013, based on a thyroid uptake and scan. She was started on methimazole at that time, she has intermittent compliance due to concerns about side effects   She saw her PCP in 09/2019 and was found to have suppressed TSH < 0.001 uIU/mL with elevated FT4 2.12 and FT3 4.6. She restarted it again 09/2019     No Fh of thyroid disease.    SUBJECTIVE:   Today (08/16/2023):  [name] is here for a follow up on hyperthyroidism .   Weight continues to fluctuate Denies constipation or diarrhea  Denies palpitations No local neck symptoms  Denies tremors  Denies  Itching  Or buring in the eyes   She is on Mounjaro  through  her PCP   Scheduled for an eye exam 1/10th /2024    Methimazole 10 mg,2  tabs  BID     HISTORY:  Past Medical History:  Past Medical History:  Diagnosis Date   Diabetes mellitus, type II ([facility])    Hyperthyroidism    Past Surgical History: No past surgical history on file. Social History:  reports that she has never smoked. She has never used smokeless tobacco. She reports that she does not drink alcohol and does not use drugs. Family History:  Family History  Problem Relation Age of Onset   Diabetes Mother    Hypertension Mother    Hyperlipidemia Mother    Cancer Mother    Hypertension Father      HOME MEDICATIONS: Allergies as of 08/16/2023       Reactions   Other Other (See Comments)        Medication List         Accurate as of August 16, 2023 11:52 AM. If you have any questions, ask your nurse or doctor.          Crestor 5 MG tablet Generic drug: rosuvastatin Take 5 mg by mouth daily.   glipiZIDE 5 MG tablet Commonly known as: GLUCOTROL Take 5 mg by mouth daily.   lisinopril 2.5 MG tablet Commonly known as: ZESTRIL Take 2.5 mg by mouth daily.   metFORMIN 1000 MG tablet Commonly known as: GLUCOPHAGE Take 1,000 mg by mouth 2 (two) times daily. What changed: Another medication with the same name was removed. Continue taking this medication, and follow the directions you see here. Changed by: Johnney Ou Desani Sprung   methimazole 10 MG tablet Commonly known as: TAPAZOLE TAKE 2 TABLETS BY MOUTH 2 TIMES DAILY.   Mounjaro 2.5 MG/0.5ML Pen Generic drug: tirzepatide Inject 2.5 mg into the skin once a week.   multivitamin tablet Take 1 tablet by mouth daily.   OneTouch Delica Plus [id number] Misc   OneTouch Verio test strip Generic drug: glucose blood          OBJECTIVE:   PHYSICAL EXAM: VS: BP 126/82 (BP Location: Left Arm, Patient Position: Sitting, Cuff Size: Large)   Pulse 74   Ht 5\' 4"  (  1.626 m)   Wt 189 lb (85.7 kg)   SpO2 96%   BMI 32.44 kg/m    EXAM: General: Pt appears well and is in NAD  Neck: General: Supple without adenopathy. Thyroid: Thyroid size normal.  No goiter or nodules appreciated.   Lungs: Clear with good BS bilat  Heart: Auscultation: RRR.  Abdomen: soft, nontender  Extremities:  BL [id number]  pretibial edema  Mental Status: Judgment, insight: Intact Orientation: Oriented to time, place, and person Mood and affect: No depression, anxiety, or agitation     DATA REVIEWED:   Latest Reference Range & Units 08/16/23 12:27  TSH 0.35 - 5.50 uIU/mL 0.16 (L)  T4,Free(Direct) 0.60 - 1.60 ng/dL 9.56  (L): Data is abnormally low   Latest Reference Range & Units 08/16/23 12:27  COMPREHENSIVE METABOLIC PANEL  Rpt !  Sodium 135 - 145  mEq/L 135  Potassium 3.5 - 5.1 mEq/L 4.4  Chloride 96 - 112 mEq/L 102  CO2 19 - 32 mEq/L 25  Glucose 70 - 99 mg/dL 213 (H)  BUN 6 - 23 mg/dL 11  Creatinine 0.86 - 5.78 mg/dL 4.69  Calcium 8.4 - 62.9 mg/dL 9.4  Alkaline Phosphatase 39 - 117 U/L 66  Albumin 3.5 - 5.2 g/dL 4.3  AST 0 - 37 U/L 15  ALT 0 - 35 U/L 18  Total Protein 6.0 - 8.3 g/dL 7.4  Total Bilirubin 0.2 - 1.2 mg/dL 0.3  GFR >52.84 mL/min 78.60  WBC 4.0 - 10.5 K/uL 5.5  RBC 3.87 - 5.11 Mil/uL 4.69  Hemoglobin 12.0 - 15.0 g/dL 13.2  HCT 44.0 - 10.2 % 40.9  MCV 78.0 - 100.0 fl 87.2  MCHC 30.0 - 36.0 g/dL 72.5  RDW 36.6 - 44.0 % 13.5  Platelets 150.0 - 400.0 K/uL 242.0  Neutrophils 43.0 - 77.0 % 50.3  Lymphocytes 12.0 - 46.0 % 40.6  Monocytes Relative 3.0 - 12.0 % 6.8  Eosinophil 0.0 - 5.0 % 1.5  Basophil 0.0 - 3.0 % 0.8  NEUT# 1.4 - 7.7 K/uL 2.8  Lymphs Abs 0.7 - 4.0 K/uL 2.2  Monocyte # 0.1 - 1.0 K/uL 0.4  Eosinophils Absolute 0.0 - 0.7 K/uL 0.1  Basophils Absolute 0.0 - 0.1 K/uL 0.0    Thyroid ultrasound 08/29/2020   Parenchymal Echotexture: Markedly heterogenous   Isthmus: 0.6 cm   Right lobe: 5.5 x 2.4 x 2.5 cm   Left lobe: 5.7 x 2.6 x 2.8 cm   _________________________________________________________   Estimated total number of nodules >/= 1 cm: 0   Number of spongiform nodules >/=  2 cm not described below (TR1): 0   Number of mixed cystic and solid nodules >/= 1.5 cm not described below (TR2): 0   _________________________________________________________   Nodule # 1:   Location: Right; Mid   Maximum size: 0.7 cm; Other 2 dimensions: 0.6 x 0.4 cm   Composition: solid/almost completely solid (2)   Echogenicity: hypoechoic (2)   Shape: not taller-than-wide (0)   Margins: smooth (0)   Echogenic foci: none (0)   ACR TI-RADS total points: 4.   ACR TI-RADS risk category: TR4 (4-6 points).   ACR TI-RADS recommendations:   Given size (<0.9 cm) and appearance, this nodule does NOT  meet TI-RADS criteria for biopsy or dedicated follow-up.   _________________________________________________________   Nodule # 2:   Location: Right; Inferior   Maximum size: 0.9 cm; Other 2 dimensions: 0.70.6 cm   Composition: solid/almost completely solid (2)   Echogenicity: hypoechoic (2)  Shape: not taller-than-wide (0)   Margins: ill-defined (0)   Echogenic foci: none (0)   ACR TI-RADS total points: 4.   ACR TI-RADS risk category: TR4 (4-6 points).   ACR TI-RADS recommendations:   Given size (<0.9 cm) and appearance, this nodule does NOT meet TI-RADS criteria for biopsy or dedicated follow-up.   _________________________________________________________   Nodule # 3:   Location: Left; Mid   Maximum size: 0.9 cm; Other 2 dimensions: 0.7 x 0.4 cm   Composition: solid/almost completely solid (2)   Echogenicity: hypoechoic (2)   Shape: not taller-than-wide (0)   Margins: ill-defined (0)   Echogenic foci: none (0)   ACR TI-RADS total points: 4.   ACR TI-RADS risk category: TR4 (4-6 points).   ACR TI-RADS recommendations:   Given size (<0.9 cm) and appearance, this nodule does NOT meet TI-RADS criteria for biopsy or dedicated follow-up.   _________________________________________________________   Nodule # 4:   Location: Left; Inferior   Maximum size: 0.7 cm; Other 2 dimensions: 0.7 x 0.5 cm   Composition: solid/almost completely solid (2)   Echogenicity: hypoechoic (2)   Shape: not taller-than-wide (0)   Margins: ill-defined (0)   Echogenic foci: none (0)   ACR TI-RADS total points: 4.   ACR TI-RADS risk category: TR4 (4-6 points).   ACR TI-RADS recommendations:   Given size (<0.9 cm) and appearance, this nodule does NOT meet TI-RADS criteria for biopsy or dedicated follow-up.   _________________________________________________________   IMPRESSION: 1. Heterogeneously enlarged thyroid compatible with multinodular goiter. 2. Each  of the observed thyroid nodules have benign sonographic characteristics and do not warrant additional ultrasound follow-up or tissue sampling.  ASSESSMENT / PLAN / RECOMMENDATIONS:   Hyperthyroidism secondary to Graves' disease  - Pt is clinically euthyroid  - Denies local neck symptoms. -Patient declines any other intervention besides continuing thionamides therapy at this time  -TSH low, patient assures me compliance, will increase methimazole as below -CBC and CMP normal   Medications  Increase methimazole 10 mg, 3 tablets QAM and 2 tabs QPM    2. Graves' Disease:    - No extra thyroidal manifestations of graves' disease     3.T2DM:  -This is managed by her PCP, and was recently started on Mounjaro    F/U in 6 months    Signed electronically by: Lyndle Herrlich, MD  [facility]  [facility] [address] [place], [place] [address] Phone: [phone] FAX: [phone]      CC: Jonathon Bellows, DO [address] [place] [address] Phone: [phone]  Fax: [phone]   Return to Endocrinology clinic as below: Future Appointments  Date Time Provider Department Center  08/16/2023  2:40 PM Chloey Ricard, Konrad Dolores, MD [facility] None

## 2023-08-19 NOTE — Telephone Encounter (Signed)
Patient aware and will make medication changes

## 2023-11-13 ENCOUNTER — Other Ambulatory Visit: Payer: Self-pay | Admitting: Internal Medicine

## 2023-11-13 DIAGNOSIS — E0500 Thyrotoxicosis with diffuse goiter without thyrotoxic crisis or storm: Secondary | ICD-10-CM

## 2024-02-14 ENCOUNTER — Encounter: Payer: Self-pay | Admitting: Internal Medicine

## 2024-02-14 ENCOUNTER — Ambulatory Visit (INDEPENDENT_AMBULATORY_CARE_PROVIDER_SITE_OTHER): Payer: Medicare Other | Admitting: Internal Medicine

## 2024-02-14 VITALS — BP 124/80 | HR 76 | Ht 64.0 in | Wt 190.0 lb

## 2024-02-14 DIAGNOSIS — E0500 Thyrotoxicosis with diffuse goiter without thyrotoxic crisis or storm: Secondary | ICD-10-CM

## 2024-02-14 DIAGNOSIS — E0590 Thyrotoxicosis, unspecified without thyrotoxic crisis or storm: Secondary | ICD-10-CM

## 2024-02-14 LAB — T4, FREE: Free T4: 1.4 ng/dL (ref 0.8–1.8)

## 2024-02-14 LAB — T3, FREE: T3, Free: 3.4 pg/mL (ref 2.3–4.2)

## 2024-02-14 LAB — TSH: TSH: 0.07 m[IU]/L — ABNORMAL LOW (ref 0.40–4.50)

## 2024-02-14 NOTE — Progress Notes (Unsigned)
 Name: Kendra Gill  MRN/ DOB: [id number], 1956-05-16    Age/ Sex: 68 y.o., female     PCP: Ava Lei, DO   Reason for Endocrinology Evaluation: Graves' disease     Initial Endocrinology Clinic Visit: 11/10/2019    PATIENT IDENTIFIER: Kendra Gill is a 68 y.o., female with a past medical history of Graves' Disease. She has followed with [facility] since 11/10/219 for consultative assistance with management of her Grave's Disease  HISTORICAL SUMMARY:   She has been diagnosed with graves' disease in 2013, based on a thyroid  uptake and scan. She was started on methimazole  at that time, she has intermittent compliance due to concerns about side effects   She saw her PCP in 09/2019 and was found to have suppressed TSH < 0.001 uIU/mL with elevated FT4 2.12 and FT3 4.6. She restarted it again 09/2019     No Fh of thyroid  disease.    SUBJECTIVE:   Today (02/14/2024):  Kendra Gill is here for a follow up on hyperthyroidism .   Weight has been stable  Denies constipation or diarrhea  Denies palpitations No local neck symptoms  Denies tremors  Denies  Itching  Or buring in the eyes  Uses a pill box   Methimazole  10 mg,3 tabs  QAM and 3 tabs QPM    HISTORY:  Past Medical History:  Past Medical History:  Diagnosis Date   Diabetes mellitus, type II (HCC)    Hyperthyroidism    Past Surgical History: No past surgical history on file. Social History:  reports that she has never smoked. She has never used smokeless tobacco. She reports that she does not drink alcohol and does not use drugs. Family History:  Family History  Problem Relation Age of Onset   Diabetes Mother    Hypertension Mother    Hyperlipidemia Mother    Cancer Mother    Hypertension Father      HOME MEDICATIONS: Allergies as of 02/14/2024       Reactions   Other Other (See Comments)        Medication List        Accurate as of Feb 14, 2024  7:08 AM. If you have  any questions, ask your nurse or doctor.          Crestor 5 MG tablet Generic drug: rosuvastatin Take 5 mg by mouth daily.   glipiZIDE 5 MG tablet Commonly known as: GLUCOTROL Take 5 mg by mouth daily.   lisinopril 2.5 MG tablet Commonly known as: ZESTRIL Take 2.5 mg by mouth daily.   metFORMIN 1000 MG tablet Commonly known as: GLUCOPHAGE Take 1,000 mg by mouth 2 (two) times daily.   methimazole  10 MG tablet Commonly known as: TAPAZOLE  Take 3 tablets (30 mg total) by mouth every morning AND 2 tablets (20 mg total) every evening.   Mounjaro 2.5 MG/0.5ML Pen Generic drug: tirzepatide Inject 2.5 mg into the skin once a week.   multivitamin tablet Take 1 tablet by mouth daily.   OneTouch Delica Plus Lancet33G Misc   OneTouch Verio test strip Generic drug: glucose blood          OBJECTIVE:   PHYSICAL EXAM: VS: There were no vitals taken for this visit.   EXAM: General: Pt appears well and is in NAD  Neck: General: Supple without adenopathy. Thyroid : Thyroid  size normal.  No goiter or nodules appreciated.   Lungs: Clear with good BS bilat  Heart: Auscultation: RRR.  Abdomen:  soft, nontender  Extremities:  BL [id number]  pretibial edema  Mental Status: Judgment, insight: Intact Orientation: Oriented to time, place, and person Mood and affect: No depression, anxiety, or agitation     DATA REVIEWED:   Latest Reference Range & Units 08/16/23 12:27  TSH 0.35 - 5.50 uIU/mL 0.16 (L)  T4,Free(Direct) 0.60 - 1.60 ng/dL 4.54  (L): Data is abnormally low   Latest Reference Range & Units 08/16/23 12:27  COMPREHENSIVE METABOLIC PANEL  Rpt !  Sodium 135 - 145 mEq/L 135  Potassium 3.5 - 5.1 mEq/L 4.4  Chloride 96 - 112 mEq/L 102  CO2 19 - 32 mEq/L 25  Glucose 70 - 99 mg/dL 098 (H)  BUN 6 - 23 mg/dL 11  Creatinine 1.19 - 1.47 mg/dL 8.29  Calcium 8.4 - 56.2 mg/dL 9.4  Alkaline Phosphatase 39 - 117 U/L 66  Albumin 3.5 - 5.2 g/dL 4.3  AST 0 - 37 U/L 15  ALT 0  - 35 U/L 18  Total Protein 6.0 - 8.3 g/dL 7.4  Total Bilirubin 0.2 - 1.2 mg/dL 0.3  GFR >13.08 mL/min 78.60  WBC 4.0 - 10.5 K/uL 5.5  RBC 3.87 - 5.11 Mil/uL 4.69  Hemoglobin 12.0 - 15.0 g/dL 65.7  HCT 84.6 - 96.2 % 40.9  MCV 78.0 - 100.0 fl 87.2  MCHC 30.0 - 36.0 g/dL 95.2  RDW 84.1 - 32.4 % 13.5  Platelets 150.0 - 400.0 K/uL 242.0  Neutrophils 43.0 - 77.0 % 50.3  Lymphocytes 12.0 - 46.0 % 40.6  Monocytes Relative 3.0 - 12.0 % 6.8  Eosinophil 0.0 - 5.0 % 1.5  Basophil 0.0 - 3.0 % 0.8  NEUT# 1.4 - 7.7 K/uL 2.8  Lymphs Abs 0.7 - 4.0 K/uL 2.2  Monocyte # 0.1 - 1.0 K/uL 0.4  Eosinophils Absolute 0.0 - 0.7 K/uL 0.1  Basophils Absolute 0.0 - 0.1 K/uL 0.0    Thyroid  ultrasound 08/29/2020   Parenchymal Echotexture: Markedly heterogenous   Isthmus: 0.6 cm   Right lobe: 5.5 x 2.4 x 2.5 cm   Left lobe: 5.7 x 2.6 x 2.8 cm   _________________________________________________________   Estimated total number of nodules >/= 1 cm: 0   Number of spongiform nodules >/=  2 cm not described below (TR1): 0   Number of mixed cystic and solid nodules >/= 1.5 cm not described below (TR2): 0   _________________________________________________________   Nodule # 1:   Location: Right; Mid   Maximum size: 0.7 cm; Other 2 dimensions: 0.6 x 0.4 cm   Composition: solid/almost completely solid (2)   Echogenicity: hypoechoic (2)   Shape: not taller-than-wide (0)   Margins: smooth (0)   Echogenic foci: none (0)   ACR TI-RADS total points: 4.   ACR TI-RADS risk category: TR4 (4-6 points).   ACR TI-RADS recommendations:   Given size (<0.9 cm) and appearance, this nodule does NOT meet TI-RADS criteria for biopsy or dedicated follow-up.   _________________________________________________________   Nodule # 2:   Location: Right; Inferior   Maximum size: 0.9 cm; Other 2 dimensions: 0.70.6 cm   Composition: solid/almost completely solid (2)   Echogenicity: hypoechoic (2)    Shape: not taller-than-wide (0)   Margins: ill-defined (0)   Echogenic foci: none (0)   ACR TI-RADS total points: 4.   ACR TI-RADS risk category: TR4 (4-6 points).   ACR TI-RADS recommendations:   Given size (<0.9 cm) and appearance, this nodule does NOT meet TI-RADS criteria for biopsy or dedicated follow-up.   _________________________________________________________  Nodule # 3:   Location: Left; Mid   Maximum size: 0.9 cm; Other 2 dimensions: 0.7 x 0.4 cm   Composition: solid/almost completely solid (2)   Echogenicity: hypoechoic (2)   Shape: not taller-than-wide (0)   Margins: ill-defined (0)   Echogenic foci: none (0)   ACR TI-RADS total points: 4.   ACR TI-RADS risk category: TR4 (4-6 points).   ACR TI-RADS recommendations:   Given size (<0.9 cm) and appearance, this nodule does NOT meet TI-RADS criteria for biopsy or dedicated follow-up.   _________________________________________________________   Nodule # 4:   Location: Left; Inferior   Maximum size: 0.7 cm; Other 2 dimensions: 0.7 x 0.5 cm   Composition: solid/almost completely solid (2)   Echogenicity: hypoechoic (2)   Shape: not taller-than-wide (0)   Margins: ill-defined (0)   Echogenic foci: none (0)   ACR TI-RADS total points: 4.   ACR TI-RADS risk category: TR4 (4-6 points).   ACR TI-RADS recommendations:   Given size (<0.9 cm) and appearance, this nodule does NOT meet TI-RADS criteria for biopsy or dedicated follow-up.   _________________________________________________________   IMPRESSION: 1. Heterogeneously enlarged thyroid  compatible with multinodular goiter. 2. Each of the observed thyroid  nodules have benign sonographic characteristics and do not warrant additional ultrasound follow-up or tissue sampling.  ASSESSMENT / PLAN / RECOMMENDATIONS:   Hyperthyroidism secondary to Graves' disease  - Pt is clinically euthyroid  - Denies local neck symptoms. -Patient  declines any other intervention besides continuing thionamides therapy at this time  -TSH low, patient assures me compliance, will increase methimazole  as below -CBC and CMP normal   Medications  Increase methimazole  10 mg, 3 tablets QAM and 2 tabs QPM    2. Graves' Disease:    - No extra thyroidal manifestations of graves' disease     3.T2DM:  -This is managed by her PCP     F/U in 6 months    Signed electronically by: Natale Bail, MD  [facility]  [facility] [address] [place], [place] [address] Phone: [phone] FAX: [phone]      CC: Ava Lei, DO [address] [place] [address] Phone: [phone]  Fax: [phone]   Return to Endocrinology clinic as below: Future Appointments  Date Time Provider Department Center  02/14/2024 11:10 AM Kindra Bickham, Julian Obey, MD [facility] None

## 2024-02-19 ENCOUNTER — Ambulatory Visit: Payer: Self-pay | Admitting: Internal Medicine

## 2024-02-19 NOTE — Telephone Encounter (Signed)
 Can you please contact the patient and let her know that her thyroid  is still overactive, unfortunately this is due to inconsistent intake of methimazole , as I have contacted her pharmacy in [place] and they clarified that the last pickup was January/06/2024 at 450 for 90-day supply, so she is behind on picking up methimazole    I will encourage the patient again to stay consistent with the methimazole  intake as she is at risk for developing osteoporosis and abnormal heart rhythm if she continues with overactive thyroid   Thanks

## 2024-06-19 ENCOUNTER — Ambulatory Visit: Admitting: Internal Medicine

## 2024-06-19 ENCOUNTER — Encounter: Payer: Self-pay | Admitting: Internal Medicine

## 2024-06-19 VITALS — BP 120/80 | HR 98 | Ht 64.0 in | Wt 178.0 lb

## 2024-06-19 DIAGNOSIS — E0500 Thyrotoxicosis with diffuse goiter without thyrotoxic crisis or storm: Secondary | ICD-10-CM | POA: Diagnosis not present

## 2024-06-19 DIAGNOSIS — E0590 Thyrotoxicosis, unspecified without thyrotoxic crisis or storm: Secondary | ICD-10-CM

## 2024-06-19 DIAGNOSIS — E042 Nontoxic multinodular goiter: Secondary | ICD-10-CM | POA: Diagnosis not present

## 2024-06-19 LAB — T4, FREE: Free T4: 2.5 ng/dL — ABNORMAL HIGH (ref 0.8–1.8)

## 2024-06-19 LAB — TSH: TSH: <0.01 m[IU]/L — ABNORMAL LOW (ref 0.40–4.50)

## 2024-06-19 LAB — T3, FREE: T3, Free: 7 pg/mL — ABNORMAL HIGH (ref 2.3–4.2)

## 2024-06-19 NOTE — Progress Notes (Signed)
 Name: Kendra Gill  MRN/ DOB: [id number], [birth year]    Age/ Sex: 68 y.o., female     PCP: Lonna Millman, DO   Reason for Endocrinology Evaluation: Graves' disease     Initial Endocrinology Clinic Visit: 11/10/2019    PATIENT IDENTIFIER: Kendra Gill is a 68 y.o., female with a past medical history of Graves' Disease. She has followed with [facility] since 11/10/219 for consultative assistance with management of her Grave's Disease  HISTORICAL SUMMARY:   She has been diagnosed with graves' disease in 2013, based on a thyroid  uptake and scan. She was started on methimazole  at that time, she has intermittent compliance due to concerns about side effects   She saw her PCP in 09/2019 and was found to have suppressed TSH < 0.001 uIU/mL with elevated FT4 2.12 and FT3 4.6. She restarted it again 09/2019     No Fh of thyroid  disease.    SUBJECTIVE:   Today (06/23/2024):  Ms. Fleissner is here for a follow up on hyperthyroidism .  Patient has been noted weight loss She didn't have methimazole  for a weekend while her daughter was in the hospital  No constipation or diarrhea Has rare  palpitations   No local neck symptoms  Denies  Itching  Or buring in the eyes  Uses a pill box   Methimazole  10 mg,3 tabs  QAM and 3 tabs QPM    HISTORY:  Past Medical History:  Past Medical History:  Diagnosis Date   Diabetes mellitus, type II (HCC)    Hyperthyroidism    Past Surgical History: No past surgical history on file. Social History:  reports that she has never smoked. She has never used smokeless tobacco. She reports that she does not drink alcohol and does not use drugs. Family History:  Family History  Problem Relation Age of Onset   Diabetes Mother    Hypertension Mother    Hyperlipidemia Mother    Cancer Mother    Hypertension Father      HOME MEDICATIONS: Allergies as of 06/19/2024       Reactions   Other Other (See Comments)         Medication List        Accurate as of June 19, 2024 11:59 PM. If you have any questions, ask your nurse or doctor.          Crestor 5 MG tablet Generic drug: rosuvastatin Take 5 mg by mouth daily.   glipiZIDE 10 MG tablet Commonly known as: GLUCOTROL Take 10 mg by mouth.   lisinopril 2.5 MG tablet Commonly known as: ZESTRIL Take 2.5 mg by mouth daily.   metFORMIN 1000 MG tablet Commonly known as: GLUCOPHAGE Take 1,000 mg by mouth 2 (two) times daily.   methimazole  10 MG tablet Commonly known as: TAPAZOLE  Take 3 tablets (30 mg total) by mouth every morning AND 2 tablets (20 mg total) every evening.   Mounjaro 2.5 MG/0.5ML Pen Generic drug: tirzepatide Inject 2.5 mg into the skin once a week.   multivitamin tablet Take 1 tablet by mouth daily.   OneTouch Delica Plus Lancet33G Misc   OneTouch Verio test strip Generic drug: glucose blood          OBJECTIVE:   PHYSICAL EXAM: VS: BP 120/80 (BP Location: Left Arm, Patient Position: Sitting, Cuff Size: Large)   Pulse 98   Ht 5' 4 (1.626 m)   Wt 178 lb (80.7 kg)   SpO2 99%  BMI 30.55 kg/m     EXAM: General: Pt appears well and is in NAD  Neck: General: Supple without adenopathy. Thyroid : Thyroid  size normal.  No goiter or nodules appreciated.   Lungs: Clear with good BS bilat  Heart: Auscultation: RRR.  Abdomen: soft, nontender  Extremities:  BL OZ:Umjrz  pretibial edema  Mental Status: Judgment, insight: Intact Orientation: Oriented to time, place, and person Mood and affect: No depression, anxiety, or agitation     DATA REVIEWED:   Latest Reference Range & Units 06/19/24 11:43  TSH 0.40 - 4.50 mIU/L <0.01 (L)  Triiodothyronine,Free,Serum 2.3 - 4.2 pg/mL 7.0 (H)  T4,Free(Direct) 0.8 - 1.8 ng/dL 2.5 (H)      Latest Reference Range & Units 08/16/23 12:27  COMPREHENSIVE METABOLIC PANEL  Rpt !  Sodium 135 - 145 mEq/L 135  Potassium 3.5 - 5.1 mEq/L 4.4  Chloride 96 - 112 mEq/L 102   CO2 19 - 32 mEq/L 25  Glucose 70 - 99 mg/dL 737 (H)  BUN 6 - 23 mg/dL 11  Creatinine 9.59 - 8.79 mg/dL 9.22  Calcium 8.4 - 89.4 mg/dL 9.4  Alkaline Phosphatase 39 - 117 U/L 66  Albumin 3.5 - 5.2 g/dL 4.3  AST 0 - 37 U/L 15  ALT 0 - 35 U/L 18  Total Protein 6.0 - 8.3 g/dL 7.4  Total Bilirubin 0.2 - 1.2 mg/dL 0.3  GFR >39.99 mL/min 78.60  WBC 4.0 - 10.5 K/uL 5.5  RBC 3.87 - 5.11 Mil/uL 4.69  Hemoglobin 12.0 - 15.0 g/dL 86.9  HCT 63.9 - 53.9 % 40.9  MCV 78.0 - 100.0 fl 87.2  MCHC 30.0 - 36.0 g/dL 68.1  RDW 88.4 - 84.4 % 13.5  Platelets 150.0 - 400.0 K/uL 242.0  Neutrophils 43.0 - 77.0 % 50.3  Lymphocytes 12.0 - 46.0 % 40.6  Monocytes Relative 3.0 - 12.0 % 6.8  Eosinophil 0.0 - 5.0 % 1.5  Basophil 0.0 - 3.0 % 0.8  NEUT# 1.4 - 7.7 K/uL 2.8  Lymphs Abs 0.7 - 4.0 K/uL 2.2  Monocyte # 0.1 - 1.0 K/uL 0.4  Eosinophils Absolute 0.0 - 0.7 K/uL 0.1  Basophils Absolute 0.0 - 0.1 K/uL 0.0    Thyroid  ultrasound 08/29/2020   Parenchymal Echotexture: Markedly heterogenous   Isthmus: 0.6 cm   Right lobe: 5.5 x 2.4 x 2.5 cm   Left lobe: 5.7 x 2.6 x 2.8 cm   _________________________________________________________   Estimated total number of nodules >/= 1 cm: 0   Number of spongiform nodules >/=  2 cm not described below (TR1): 0   Number of mixed cystic and solid nodules >/= 1.5 cm not described below (TR2): 0   _________________________________________________________   Nodule # 1:   Location: Right; Mid   Maximum size: 0.7 cm; Other 2 dimensions: 0.6 x 0.4 cm   Composition: solid/almost completely solid (2)   Echogenicity: hypoechoic (2)   Shape: not taller-than-wide (0)   Margins: smooth (0)   Echogenic foci: none (0)   ACR TI-RADS total points: 4.   ACR TI-RADS risk category: TR4 (4-6 points).   ACR TI-RADS recommendations:   Given size (<0.9 cm) and appearance, this nodule does NOT meet TI-RADS criteria for biopsy or dedicated follow-up.    _________________________________________________________   Nodule # 2:   Location: Right; Inferior   Maximum size: 0.9 cm; Other 2 dimensions: 0.70.6 cm   Composition: solid/almost completely solid (2)   Echogenicity: hypoechoic (2)   Shape: not taller-than-wide (0)   Margins: ill-defined (  0)   Echogenic foci: none (0)   ACR TI-RADS total points: 4.   ACR TI-RADS risk category: TR4 (4-6 points).   ACR TI-RADS recommendations:   Given size (<0.9 cm) and appearance, this nodule does NOT meet TI-RADS criteria for biopsy or dedicated follow-up.   _________________________________________________________   Nodule # 3:   Location: Left; Mid   Maximum size: 0.9 cm; Other 2 dimensions: 0.7 x 0.4 cm   Composition: solid/almost completely solid (2)   Echogenicity: hypoechoic (2)   Shape: not taller-than-wide (0)   Margins: ill-defined (0)   Echogenic foci: none (0)   ACR TI-RADS total points: 4.   ACR TI-RADS risk category: TR4 (4-6 points).   ACR TI-RADS recommendations:   Given size (<0.9 cm) and appearance, this nodule does NOT meet TI-RADS criteria for biopsy or dedicated follow-up.   _________________________________________________________   Nodule # 4:   Location: Left; Inferior   Maximum size: 0.7 cm; Other 2 dimensions: 0.7 x 0.5 cm   Composition: solid/almost completely solid (2)   Echogenicity: hypoechoic (2)   Shape: not taller-than-wide (0)   Margins: ill-defined (0)   Echogenic foci: none (0)   ACR TI-RADS total points: 4.   ACR TI-RADS risk category: TR4 (4-6 points).   ACR TI-RADS recommendations:   Given size (<0.9 cm) and appearance, this nodule does NOT meet TI-RADS criteria for biopsy or dedicated follow-up.   _________________________________________________________   IMPRESSION: 1. Heterogeneously enlarged thyroid  compatible with multinodular goiter. 2. Each of the observed thyroid  nodules have benign  sonographic characteristics and do not warrant additional ultrasound follow-up or tissue sampling.  ASSESSMENT / PLAN / RECOMMENDATIONS:   Hyperthyroidism secondary to Graves' disease  - Patient is clinically euthyroid -Patient with history of uncontrolled hyperthyroidism due to imperfect adherence to methimazole  -On her last visit and after verifying with the pharmacy, the patient has not had methimazole  in a month -Today she assures me compliance and that she does use her pillbox -She again declines RAI as well as surgical intervention - We again discussed the risk of cardiac arrhythmia and its risk and CVA and A-fib as well as CHF, and encourage compliance - Pick up history from the pharmacy as below:   10/16/2023 # 450 02/29/2024 # 450 05/27/2024  # 450   Medications  Continue methimazole  10 mg, 3 tablets QAM and 3 tabs QPM    2. Graves' Disease:    - No extra thyroidal manifestations of graves' disease    I have attempted to call the patient on 06/23/2024 to discuss and controlled TFTs and to strongly suggest and consider radioactive iodine ablation, no answer, voicemail was left, portal message will be sent  Signed electronically by: Stefano Redgie Butts, MD  [facility]  [facility] [address] [place], [place] [address] Phone: [phone] FAX: [phone]      CC: Lonna Millman, DO [address] [place] [address] Phone: [phone]  Fax: [phone]   Return to Endocrinology clinic as below: Future Appointments  Date Time Provider Department Center  12/18/2024 11:10 AM Caela Huot, Donell Redgie, MD [facility] None

## 2024-06-23 ENCOUNTER — Encounter: Payer: Self-pay | Admitting: Internal Medicine

## 2024-08-22 ENCOUNTER — Other Ambulatory Visit: Payer: Self-pay | Admitting: Internal Medicine

## 2024-08-22 DIAGNOSIS — E0500 Thyrotoxicosis with diffuse goiter without thyrotoxic crisis or storm: Secondary | ICD-10-CM

## 2024-12-18 ENCOUNTER — Ambulatory Visit: Admitting: Internal Medicine
